# Patient Record
Sex: Female | Born: 1955
Health system: Southern US, Community
[De-identification: ages and names within clinical notes are randomized; demographics above are authoritative.]

## PROBLEM LIST (undated history)

## (undated) HISTORY — PX: BREAST CYST EXCISION: SHX579

## (undated) HISTORY — PX: APPENDECTOMY: SHX54

---

## 1998-11-04 ENCOUNTER — Observation Stay (HOSPITAL_COMMUNITY): Admission: AD | Admit: 1998-11-04 | Discharge: 1998-11-05 | Payer: Self-pay | Admitting: Obstetrics and Gynecology

## 1998-12-05 ENCOUNTER — Other Ambulatory Visit: Admission: RE | Admit: 1998-12-05 | Discharge: 1998-12-05 | Payer: Self-pay | Admitting: Obstetrics and Gynecology

## 2000-12-29 ENCOUNTER — Emergency Department (HOSPITAL_COMMUNITY): Admission: EM | Admit: 2000-12-29 | Discharge: 2000-12-29 | Payer: Self-pay | Admitting: Emergency Medicine

## 2002-02-09 ENCOUNTER — Emergency Department (HOSPITAL_COMMUNITY): Admission: EM | Admit: 2002-02-09 | Discharge: 2002-02-09 | Payer: Self-pay | Admitting: *Deleted

## 2004-12-17 ENCOUNTER — Emergency Department (HOSPITAL_COMMUNITY): Admission: EM | Admit: 2004-12-17 | Discharge: 2004-12-17 | Payer: Self-pay | Admitting: Emergency Medicine

## 2005-07-04 ENCOUNTER — Emergency Department (HOSPITAL_COMMUNITY): Admission: EM | Admit: 2005-07-04 | Discharge: 2005-07-04 | Payer: Self-pay | Admitting: *Deleted

## 2013-08-28 ENCOUNTER — Encounter (HOSPITAL_COMMUNITY): Payer: Self-pay | Admitting: Emergency Medicine

## 2013-08-28 ENCOUNTER — Emergency Department (HOSPITAL_COMMUNITY)
Admission: EM | Admit: 2013-08-28 | Discharge: 2013-08-28 | Disposition: A | Discharge status: Home / Self Care | Payer: Self-pay | Source: Home / Self Care | Attending: Emergency Medicine | Admitting: Emergency Medicine

## 2013-08-28 DIAGNOSIS — L02413 Cutaneous abscess of right upper limb: Secondary | ICD-10-CM

## 2013-08-28 DIAGNOSIS — IMO0002 Reserved for concepts with insufficient information to code with codable children: Secondary | ICD-10-CM

## 2013-08-28 MED ORDER — HYDROCODONE-ACETAMINOPHEN 5-325 MG PO TABS: ORAL_TABLET | ORAL | Status: DC

## 2013-08-28 MED ORDER — SULFAMETHOXAZOLE-TMP DS 800-160 MG PO TABS: 2.0000 | ORAL_TABLET | Freq: Two times a day (BID) | ORAL | Status: DC

## 2013-08-28 NOTE — ED Provider Notes (Signed)
Chief Complaint:   Chief Complaint  Patient presents with  . Recurrent Skin Infections    History of Present Illness:    Alexandra Robinson is a 57 year old female had a one half to two-week history of a painful, swollen area on her right forearm. This is felt hot but has not drained any blood or pus. She denies any fever. No prior history of skin infections, abscesses, or MRSA. She does not have diabetes.  Review of Systems:  Other than noted above, the patient denies any of the following symptoms: Systemic:  No fever, chills or sweats. Skin:  No rash or itching.  PMFSH:  Past medical history, family history, social history, meds, and allergies were reviewed.  No history of diabetes or prior history of abscesses or MRSA.  Physical Exam:   Vital signs:  BP 130/76  Pulse 70  Temp(Src) 98.4 F (36.9 C) (Oral)  Resp 16  SpO2 99% Skin:  There is a 6 cm raised, red, tender, indurated area on the right forearm with a small, central scab. There is no fluctuance.  Skin exam was otherwise normal.  No rash. Ext:  Distal pulses were full, patient has full ROM of all joints.    Procedure:  Verbal informed consent was obtained.  The patient was informed of the risks and benefits of the procedure and understands and accepts.  Identity of the patient was verified verbally and by wristband.   The abscess area described above was prepped with Betadine and alcohol and anesthetized with 3 mL of 2% Xylocaine with epinephrine.  Using a #11 scalpel blade, a singe straight incision was made into the area of fluctulence, yielding no prurulent drainage.  Routine cultures were obtained.  Blunt dissection was used to break up loculations and the resulting wound cavity was packed with 1/4 inch Iodoform gauze.  A sterile pressure dressing was applied.  Assessment:  The encounter diagnosis was Abscess of right forearm.  This may be more of a cellulitis and abscess. No pus was returned during the incision and  drainage.  Plan:   1.  Meds:  The following meds were prescribed:   Discharge Medication List as of 08/28/2013  5:36 PM    START taking these medications   Details  HYDROcodone-acetaminophen (NORCO/VICODIN) 5-325 MG per tablet 1 to 2 tabs every 4 to 6 hours as needed for pain., Print    sulfamethoxazole-trimethoprim (BACTRIM DS) 800-160 MG per tablet Take 2 tablets by mouth 2 (two) times daily., Starting 08/28/2013, Until Discontinued, Normal        2.  Patient Education/Counseling:  The patient was given appropriate handouts, self care instructions, and instructed in symptomatic relief.    3.  Follow up:  The patient was instructed to leave the dressing in place and remove the dressing and packing in 48 hours. Thereafter she may wash with soap and water, apply antibiotic ointment, and a Band-Aid. She was instructed to return if becoming worse in any way, and given some red flag symptoms such as fever which would prompt immediate return.  Follow up  Here as necessary.     Reuben Likes, MD 08/28/13 Rosamaria Lints

## 2013-08-28 NOTE — ED Notes (Signed)
Pt  Has  Red  Swollen   drianing    Area  On r  Arm       She  Reports  The  Symptoms  For  sev  Weeks  She  denys  Any  Known  Injury  She  Reports  As  Well  That  Other  Family  Members  Had  Similar  Symptoms  As  Well

## 2013-08-31 LAB — CULTURE, ROUTINE-ABSCESS: Special Requests: NORMAL

## 2013-09-02 ENCOUNTER — Telehealth (HOSPITAL_COMMUNITY): Payer: Self-pay | Admitting: *Deleted

## 2013-09-02 NOTE — ED Notes (Signed)
Abscess culture R forearm: Mod. MRSA.  Pt. adequately treated with Bactrim DS.  12/2 I called but line was busy.  12/4  I called. Pt. verified x 2 and given results.  Pt. told she was adequately treated and I reviewed the [facility] MRSA instructions with her. Pt.'s questions answered. Pt. voiced understanding. Vassie Moselle 09/02/2013

## 2016-01-22 ENCOUNTER — Encounter (HOSPITAL_COMMUNITY): Payer: Self-pay | Admitting: *Deleted

## 2016-01-22 ENCOUNTER — Ambulatory Visit (HOSPITAL_COMMUNITY)
Admission: EM | Admit: 2016-01-22 | Discharge: 2016-01-22 | Disposition: A | Discharge status: Home / Self Care | Payer: BLUE CROSS/BLUE SHIELD | Attending: Family Medicine | Admitting: Family Medicine

## 2016-01-22 DIAGNOSIS — J069 Acute upper respiratory infection, unspecified: Secondary | ICD-10-CM

## 2016-01-22 MED ORDER — AZITHROMYCIN 250 MG PO TABS: 250.0000 mg | ORAL_TABLET | Freq: Every day | ORAL | Status: DC

## 2016-01-22 MED ORDER — BENZONATATE 100 MG PO CAPS: 100.0000 mg | ORAL_CAPSULE | Freq: Three times a day (TID) | ORAL | Status: DC

## 2016-01-22 NOTE — ED Provider Notes (Signed)
CSN: [id number]     Arrival date & time 01/22/16  1515 History   First MD Initiated Contact with Patient 01/22/16 1628     Chief Complaint  Patient presents with  . URI  . Sore Throat  . Cough   (Consider location/radiation/quality/duration/timing/severity/associated sxs/prior Treatment) HPI Onset of chest cold 1 week ago. States she has been using OTC meds without relief of symptoms. 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 No associated pain, no PMH, non smoker.  History reviewed. No pertinent past medical history. Past Surgical History  Procedure Laterality Date  . Appendectomy     History reviewed. No pertinent family history. Social History  Substance Use Topics  . Smoking status: Never Smoker   . Smokeless tobacco: None  . Alcohol Use: No   OB History    No data available     Review of Systems ROS +'ve cough  Denies: HEADACHE, NAUSEA, ABDOMINAL PAIN, CHEST PAIN, CONGESTION, DYSURIA, SHORTNESS OF BREATH  Allergies  Review of patient's allergies indicates no known allergies.  Home Medications   Prior to Admission medications   Medication Sig Start Date End Date Taking? Authorizing Provider  azithromycin (ZITHROMAX) 250 MG tablet Take 1 tablet (250 mg total) by mouth daily. Take first 2 tablets together, then 1 every day until finished. 01/22/16   Konrad Felix, PA  benzonatate (TESSALON) 100 MG capsule Take 1 capsule (100 mg total) by mouth every 8 (eight) hours. 01/22/16   Konrad Felix, PA  HYDROcodone-acetaminophen (NORCO/VICODIN) 5-325 MG per tablet 1 to 2 tabs every 4 to 6 hours as needed for pain. 08/28/13   Harden Mo, MD  sulfamethoxazole-trimethoprim (BACTRIM DS) 800-160 MG per tablet Take 2 tablets by mouth 2 (two) times daily. 08/28/13   Harden Mo, MD   Meds Ordered and Administered this Visit  Medications - No data to display  BP 142/78 mmHg  Pulse 74  Temp(Src) 98.4 F (36.9 C) (Oral)  Resp 16  SpO2  99% No data found.   Physical Exam NURSES NOTES AND VITAL SIGNS REVIEWED. CONSTITUTIONAL: Well developed, well nourished, no acute distress HEENT: normocephalic, atraumatic, right and left TM's are normal EYES: Conjunctiva normal NECK:normal ROM, supple, no adenopathy PULMONARY:No respiratory distress, normal effort, Lungs: CTAb/l, no wheezes, or increased work of breathing CARDIOVASCULAR: RRR, no murmur ABDOMEN: soft, ND, NT, +'ve BS MUSCULOSKELETAL: Normal ROM of all extremities,  SKIN: warm and dry without rash PSYCHIATRIC: Mood and affect, behavior are normal  ED Course  Procedures (including critical care time)  Labs Review Labs Reviewed - No data to display  Imaging Review No results found.   Visual Acuity Review  Right Eye Distance:   Left Eye Distance:   Bilateral Distance:    Right Eye Near:   Left Eye Near:    Bilateral Near:      RX zpak, tessalon   MDM   1. Acute URI     Patient is reassured that there are no issues that require transfer to higher level of care at this time or additional tests. Patient is advised to continue home symptomatic treatment. Patient is advised that if there are new or worsening symptoms to attend the emergency department, contact primary care provider, or return to UC. Instructions of care provided discharged home in stable condition.    THIS NOTE WAS GENERATED USING A VOICE RECOGNITION SOFTWARE PROGRAM. ALL REASONABLE EFFORTS  WERE MADE TO PROOFREAD THIS DOCUMENT FOR ACCURACY.  I have verbally  reviewed the discharge instructions with the patient. A printed AVS was given to the patient.  All questions were answered prior to discharge.      Konrad Felix, PA 01/22/16 1737

## 2016-01-22 NOTE — Discharge Instructions (Signed)
Upper Respiratory Infection, Adult Most upper respiratory infections (URIs) are a viral infection of the air passages leading to the lungs. A URI affects the nose, throat, and upper air passages. The most common type of URI is nasopharyngitis and is typically referred to as "the common cold." URIs run their course and usually go away on their own. Most of the time, a URI does not require medical attention, but sometimes a bacterial infection in the upper airways can follow a viral infection. This is called a secondary infection. Sinus and middle ear infections are common types of secondary upper respiratory infections. Bacterial pneumonia can also complicate a URI. A URI can worsen asthma and chronic obstructive pulmonary disease (COPD). Sometimes, these complications can require emergency medical care and may be life threatening.  CAUSES Almost all URIs are caused by viruses. A virus is a type of germ and can spread from one person to another.  RISKS FACTORS You may be at risk for a URI if:   You smoke.   You have chronic heart or lung disease.  You have a weakened defense (immune) system.   You are very young or very old.   You have nasal allergies or asthma.  You work in crowded or poorly ventilated areas.  You work in health care facilities or schools. SIGNS AND SYMPTOMS  Symptoms typically develop 2-3 days after you come in contact with a cold virus. Most viral URIs last 7-10 days. However, viral URIs from the influenza virus (flu virus) can last 14-18 days and are typically more severe. Symptoms may include:   Runny or stuffy (congested) nose.   Sneezing.   Cough.   Sore throat.   Headache.   Fatigue.   Fever.   Loss of appetite.   Pain in your forehead, behind your eyes, and over your cheekbones (sinus pain).  Muscle aches.  DIAGNOSIS  Your health care provider may diagnose a URI by:  Physical exam.  Tests to check that your symptoms are not due to  another condition such as:  Strep throat.  Sinusitis.  Pneumonia.  Asthma. TREATMENT  A URI goes away on its own with time. It cannot be cured with medicines, but medicines may be prescribed or recommended to relieve symptoms. Medicines may help:  Reduce your fever.  Reduce your cough.  Relieve nasal congestion. HOME CARE INSTRUCTIONS   Take medicines only as directed by your health care provider.   Gargle warm saltwater or take cough drops to comfort your throat as directed by your health care provider.  Use a warm mist humidifier or inhale steam from a shower to increase air moisture. This may make it easier to breathe.  Drink enough fluid to keep your urine clear or pale yellow.   Eat soups and other clear broths and maintain good nutrition.   Rest as needed.   Return to work when your temperature has returned to normal or as your health care provider advises. You may need to stay home longer to avoid infecting others. You can also use a face mask and careful hand washing to prevent spread of the virus.  Increase the usage of your inhaler if you have asthma.   Do not use any tobacco products, including cigarettes, chewing tobacco, or electronic cigarettes. If you need help quitting, ask your health care provider. PREVENTION  The best way to protect yourself from getting a cold is to practice good hygiene.   Avoid oral or hand contact with people with cold  symptoms.   Wash your hands often if contact occurs.  There is no clear evidence that vitamin C, vitamin E, echinacea, or exercise reduces the chance of developing a cold. However, it is always recommended to get plenty of rest, exercise, and practice good nutrition.  SEEK MEDICAL CARE IF:   You are getting worse rather than better.   Your symptoms are not controlled by medicine.   You have chills.  You have worsening shortness of breath.  You have brown or red mucus.  You have yellow or brown nasal  discharge.  You have pain in your face, especially when you bend forward.  You have a fever.  You have swollen neck glands.  You have pain while swallowing.  You have white areas in the back of your throat. SEEK IMMEDIATE MEDICAL CARE IF:   You have severe or persistent:  Headache.  Ear pain.  Sinus pain.  Chest pain.  You have chronic lung disease and any of the following:  Wheezing.  Prolonged cough.  Coughing up blood.  A change in your usual mucus.  You have a stiff neck.  You have changes in your:  Vision.  Hearing.  Thinking.  Mood. MAKE SURE YOU:   Understand these instructions.  Will watch your condition.  Will get help right away if you are not doing well or get worse.   This information is not intended to replace advice given to you by your health care provider. Make sure you discuss any questions you have with your health care provider.   Document Released: 03/12/2001 Document Revised: 01/31/2015 Document Reviewed: 12/22/2013 Elsevier Interactive Patient Education 2016 [employer]  Cough, Adult A cough helps to clear your throat and lungs. A cough may last only 2-3 weeks (acute), or it may last longer than 8 weeks (chronic). Many different things can cause a cough. A cough may be a sign of an illness or another medical condition. HOME CARE  Pay attention to any changes in your cough.  Take medicines only as told by your doctor.  If you were prescribed an antibiotic medicine, take it as told by your doctor. Do not stop taking it even if you start to feel better.  Talk with your doctor before you try using a cough medicine.  Drink enough fluid to keep your pee (urine) clear or pale yellow.  If the air is dry, use a cold steam vaporizer or humidifier in your home.  Stay away from things that make you cough at work or at home.  If your cough is worse at night, try using extra pillows to raise your head up higher while you  sleep.  Do not smoke, and try not to be around smoke. If you need help quitting, ask your doctor.  Do not have caffeine.  Do not drink alcohol.  Rest as needed. GET HELP IF:  You have new problems (symptoms).  You cough up yellow fluid (pus).  Your cough does not get better after 2-3 weeks, or your cough gets worse.  Medicine does not help your cough and you are not sleeping well.  You have pain that gets worse or pain that is not helped with medicine.  You have a fever.  You are losing weight and you do not know why.  You have night sweats. GET HELP RIGHT AWAY IF:  You cough up blood.  You have trouble breathing.  Your heartbeat is very fast.   This information is not intended to replace  advice given to you by your health care provider. Make sure you discuss any questions you have with your health care provider. °  °Document Released: 05/30/2011 Document Revised: 06/07/2015 Document Reviewed: 11/23/2014 °Elsevier Interactive Patient Education ©2016 [employer] ° °

## 2016-01-22 NOTE — ED Notes (Addendum)
Patient reports productive cough x 1 week, reports had fever and chills throughout the last week, with sore throat. Reports no relief with home medications. Patient reports at night she feels as if she is wheezing, reports phlegm very productive at night.

## 2016-02-12 ENCOUNTER — Encounter (HOSPITAL_COMMUNITY): Payer: Self-pay | Admitting: Emergency Medicine

## 2016-02-12 ENCOUNTER — Ambulatory Visit (INDEPENDENT_AMBULATORY_CARE_PROVIDER_SITE_OTHER): Payer: BLUE CROSS/BLUE SHIELD

## 2016-02-12 ENCOUNTER — Ambulatory Visit (HOSPITAL_COMMUNITY)
Admission: EM | Admit: 2016-02-12 | Discharge: 2016-02-12 | Disposition: A | Discharge status: Home / Self Care | Payer: BLUE CROSS/BLUE SHIELD | Attending: Family Medicine | Admitting: Family Medicine

## 2016-02-12 DIAGNOSIS — M79605 Pain in left leg: Secondary | ICD-10-CM | POA: Diagnosis not present

## 2016-02-12 DIAGNOSIS — J40 Bronchitis, not specified as acute or chronic: Secondary | ICD-10-CM | POA: Diagnosis not present

## 2016-02-12 MED ORDER — ALBUTEROL SULFATE HFA 108 (90 BASE) MCG/ACT IN AERS: 2.0000 | INHALATION_SPRAY | RESPIRATORY_TRACT | Status: DC | PRN

## 2016-02-12 MED ORDER — PREDNISONE 10 MG PO TABS: 20.0000 mg | ORAL_TABLET | Freq: Every day | ORAL | Status: DC

## 2016-02-12 NOTE — ED Notes (Signed)
Patient seen in April/2017 for uri.  Patient reports she continues to have a cough,  Patient is requesting a chest xray.   Patient reports left left leg pain pain for 2 years.  Patient reports pain starts in left foot, sometimes top of foot, sometimes under foot.  Reports pain goes up leg to her back.  Patient has had worsening symptoms over the last 2 weeks.  Particularly had pain when lifted lower leg, bending knee.

## 2016-02-12 NOTE — Discharge Instructions (Signed)
How to Use an Inhaler Using your inhaler correctly is very important. Good technique will make sure that the medicine reaches your lungs.  HOW TO USE AN INHALER:  Take the cap off the inhaler.  If this is the first time using your inhaler, you need to prime it. Shake the inhaler for 5 seconds. Release four puffs into the air, away from your face. Ask your doctor for help if you have questions.  Shake the inhaler for 5 seconds.  Turn the inhaler so the bottle is above the mouthpiece.  Put your pointer finger on top of the bottle. Your thumb holds the bottom of the inhaler.  Open your mouth.  Either hold the inhaler away from your mouth (the width of 2 fingers) or place your lips tightly around the mouthpiece. Ask your doctor which way to use your inhaler.  Breathe out as much air as possible.  Breathe in and push down on the bottle 1 time to release the medicine. You will feel the medicine go in your mouth and throat.  Continue to take a deep breath in very slowly. Try to fill your lungs.  After you have breathed in completely, hold your breath for 10 seconds. This will help the medicine to settle in your lungs. If you cannot hold your breath for 10 seconds, hold it for as long as you can before you breathe out.  Breathe out slowly, through pursed lips. Whistling is an example of pursed lips.  If your doctor has told you to take more than 1 puff, wait at least 15-30 seconds between puffs. This will help you get the best results from your medicine. Do not use the inhaler more than your doctor tells you to.  Put the cap back on the inhaler.  Follow the directions from your doctor or from the inhaler package about cleaning the inhaler. If you use more than one inhaler, ask your doctor which inhalers to use and what order to use them in. Ask your doctor to help you figure out when you will need to refill your inhaler.  If you use a steroid inhaler, always rinse your mouth with water  after your last puff, gargle and spit out the water. Do not swallow the water. GET HELP IF:  The inhaler medicine only partially helps to stop wheezing or shortness of breath.  You are having trouble using your inhaler.  You have some increase in thick spit (phlegm). GET HELP RIGHT AWAY IF:  The inhaler medicine does not help your wheezing or shortness of breath or you have tightness in your chest.  You have dizziness, headaches, or fast heart rate.  You have chills, fever, or night sweats.  You have a large increase of thick spit, or your thick spit is bloody. MAKE SURE YOU:   Understand these instructions.  Will watch your condition.  Will get help right away if you are not doing well or get worse.   This information is not intended to replace advice given to you by your health care provider. Make sure you discuss any questions you have with your health care provider.   Document Released: 06/25/2008 Document Revised: 07/07/2013 Document Reviewed: 04/15/2013 Elsevier Interactive Patient Education 2016 [place] therapy can help ease sore, stiff, injured, and tight muscles and joints. Heat relaxes your muscles, which may help ease your pain. Heat therapy should only be used on old, pre-existing, or long-lasting (chronic) injuries. Do not use heat therapy unless  told by your doctor. HOW TO USE HEAT THERAPY There are several different kinds of heat therapy, including:  Moist heat pack.  Warm water bath.  Hot water bottle.  Electric heating pad.  Heated gel pack.  Heated wrap.  Electric heating pad. GENERAL HEAT THERAPY RECOMMENDATIONS   Do not sleep while using heat therapy. Only use heat therapy while you are awake.  Your skin may turn pink while using heat therapy. Do not use heat therapy if your skin turns red.  Do not use heat therapy if you have new pain.  High heat or long exposure to heat can cause burns. Be careful when using heat  therapy to avoid burning your skin.  Do not use heat therapy on areas of your skin that are already irritated, such as with a rash or sunburn. GET HELP IF:   You have blisters, redness, swelling (puffiness), or numbness.  You have new pain.  Your pain is worse. MAKE SURE YOU:  Understand these instructions.  Will watch your condition.  Will get help right away if you are not doing well or get worse.   This information is not intended to replace advice given to you by your health care provider. Make sure you discuss any questions you have with your health care provider.   Document Released: 12/09/2011 Document Revised: 10/07/2014 Document Reviewed: 11/09/2013 Elsevier Interactive Patient Education 2016 [employer] Acute Bronchitis Bronchitis is inflammation of the airways that extend from the windpipe into the lungs (bronchi). The inflammation often causes mucus to develop. This leads to a cough, which is the most common symptom of bronchitis.  In acute bronchitis, the condition usually develops suddenly and goes away over time, usually in a couple weeks. Smoking, allergies, and asthma can make bronchitis worse. Repeated episodes of bronchitis may cause further lung problems.  CAUSES Acute bronchitis is most often caused by the same virus that causes a cold. The virus can spread from person to person (contagious) through coughing, sneezing, and touching contaminated objects. SIGNS AND SYMPTOMS   Cough.   Fever.   Coughing up mucus.   Body aches.   Chest congestion.   Chills.   Shortness of breath.   Sore throat.  DIAGNOSIS  Acute bronchitis is usually diagnosed through a physical exam. Your health care provider will also ask you questions about your medical history. Tests, such as chest X-rays, are sometimes done to rule out other conditions.  TREATMENT  Acute bronchitis usually goes away in a couple weeks. Oftentimes, no medical treatment is necessary.  Medicines are sometimes given for relief of fever or cough. Antibiotic medicines are usually not needed but may be prescribed in certain situations. In some cases, an inhaler may be recommended to help reduce shortness of breath and control the cough. A cool mist vaporizer may also be used to help thin bronchial secretions and make it easier to clear the chest.  HOME CARE INSTRUCTIONS  Get plenty of rest.   Drink enough fluids to keep your urine clear or pale yellow (unless you have a medical condition that requires fluid restriction). Increasing fluids may help thin your respiratory secretions (sputum) and reduce chest congestion, and it will prevent dehydration.   Take medicines only as directed by your health care provider.  If you were prescribed an antibiotic medicine, finish it all even if you start to feel better.  Avoid smoking and secondhand smoke. Exposure to cigarette smoke or irritating chemicals will make bronchitis worse. If you are a  smoker, consider using nicotine gum or skin patches to help control withdrawal symptoms. Quitting smoking will help your lungs heal faster.   Reduce the chances of another bout of acute bronchitis by washing your hands frequently, avoiding people with cold symptoms, and trying not to touch your hands to your mouth, nose, or eyes.   Keep all follow-up visits as directed by your health care provider.  SEEK MEDICAL CARE IF: Your symptoms do not improve after 1 week of treatment.  SEEK IMMEDIATE MEDICAL CARE IF:  You develop an increased fever or chills.   You have chest pain.   You have severe shortness of breath.  You have bloody sputum.   You develop dehydration.  You faint or repeatedly feel like you are going to pass out.  You develop repeated vomiting.  You develop a severe headache. MAKE SURE YOU:   Understand these instructions.  Will watch your condition.  Will get help right away if you are not doing well or get  worse.   This information is not intended to replace advice given to you by your health care provider. Make sure you discuss any questions you have with your health care provider.   Document Released: 10/24/2004 Document Revised: 10/07/2014 Document Reviewed: 03/09/2013 Elsevier Interactive Patient Education 2016 [place] Cyst A Baker cyst is a sac-like structure that forms in the back of the knee. It is filled with the same fluid that is located in your knee. This fluid lubricates the bones and cartilage of the knee and allows them to move over each other more easily. CAUSES  When the knee becomes injured or inflamed, increased fluid forms in the knee. When this happens, the joint lining is pushed out behind the knee and forms the Baker cyst. This cyst may also be caused by inflammation from arthritic conditions and infections. SIGNS AND SYMPTOMS  A Baker cyst usually has no symptoms. When the cyst is substantially enlarged:  You may feel pressure behind the knee, stiffness in the knee, or a mass in the area behind the knee.  You may develop pain, redness, and swelling in the calf. This can suggest a blood clot and requires evaluation by your health care provider. DIAGNOSIS  A Baker cyst is most often found during an ultrasound exam. This exam may have been performed for other reasons, and the cyst was found incidentally. Sometimes an MRI is used. This picks up other problems within a joint that an ultrasound exam may not. If the Baker cyst developed immediately after an injury, X-ray exams may be used to diagnose the cyst. TREATMENT  The treatment depends on the cause of the cyst. Anti-inflammatory medicines and rest often will be prescribed. If the cyst is caused by a bacterial infection, antibiotic medicines may be prescribed.  HOME CARE INSTRUCTIONS   If the cyst was caused by an injury, for the first 24 hours, keep the injured leg elevated on 2 pillows while lying  down.  For the first 24 hours while you are awake, apply ice to the injured area:  Put ice in a plastic bag.  Place a towel between your skin and the bag.  Leave the ice on for 20 minutes, 2-3 times a day.  Only take over-the-counter or prescription medicines for pain, discomfort, or fever as directed by your health care provider.  Only take antibiotic medicine as directed. Make sure to finish it even if you start to feel better. MAKE SURE YOU:   Understand these  instructions.  Will watch your condition.  Will get help right away if you are not doing well or get worse.   This information is not intended to replace advice given to you by your health care provider. Make sure you discuss any questions you have with your health care provider.   Document Released: 09/16/2005 Document Revised: 07/07/2013 Document Reviewed: 04/28/2013 Elsevier Interactive Patient Education [employer].

## 2016-02-12 NOTE — ED Provider Notes (Signed)
CSN: [id number]     Arrival date & time 02/12/16  1358 History   First MD Initiated Contact with Patient 02/12/16 1501     Chief Complaint  Patient presents with  . URI  . Leg Pain   (Consider location/radiation/quality/duration/timing/severity/associated sxs/prior Treatment) HPI History obtained from patient:  Pt presents with the cc of: Cough and left leg pain Duration of symptoms: Cough has been several weeks left leg pain over the last week Treatment prior to arrival: Treatment of cough was with albuterol and a visit to urgent care no treatment for left leg pain Context: Patient states that she has had persistent cough since being treated in the urgent care for reactive airway. She describes the cough is dry and nonproductive. She has had left leg pain for several months by her report but getting worse over the last week starts in her foot and works its way up to mid thigh. She states that it does not bother her buttock or her back. She does work as a Regulatory affairs officer. Other symptoms include: Dry non-productive cough Pain score: 4-5 at its worse FAMILY HISTORY: SOCIAL HISTORY: Nonsmoker  History reviewed. No pertinent past medical history. Past Surgical History  Procedure Laterality Date  . Appendectomy     No family history on file. Social History  Substance Use Topics  . Smoking status: Never Smoker   . Smokeless tobacco: None  . Alcohol Use: No   OB History    No data available     Review of Systems ROS +'ve left leg pain, cough  Denies: HEADACHE, NAUSEA, ABDOMINAL PAIN, CHEST PAIN, CONGESTION, DYSURIA, SHORTNESS OF BREATH  Allergies  Review of patient's allergies indicates no known allergies.  Home Medications   Prior to Admission medications   Medication Sig Start Date End Date Taking? Authorizing Provider  azithromycin (ZITHROMAX) 250 MG tablet Take 1 tablet (250 mg total) by mouth daily. Take first 2 tablets together, then 1 every day until finished. Patient not  taking: Reported on 02/12/2016 01/22/16   Konrad Felix, PA  benzonatate (TESSALON) 100 MG capsule Take 1 capsule (100 mg total) by mouth every 8 (eight) hours. 01/22/16   Konrad Felix, PA  HYDROcodone-acetaminophen (NORCO/VICODIN) 5-325 MG per tablet 1 to 2 tabs every 4 to 6 hours as needed for pain. 08/28/13   Harden Mo, MD  sulfamethoxazole-trimethoprim (BACTRIM DS) 800-160 MG per tablet Take 2 tablets by mouth 2 (two) times daily. Patient not taking: Reported on 02/12/2016 08/28/13   Harden Mo, MD   Meds Ordered and Administered this Visit  Medications - No data to display  BP 126/62 mmHg  Pulse 74  Temp(Src) 99.3 F (37.4 C) (Oral)  Resp 12  SpO2 98% No data found.   Physical Exam NURSES NOTES AND VITAL SIGNS REVIEWED. CONSTITUTIONAL: Well developed, well nourished, no acute distress HEENT: normocephalic, atraumatic EYES: Conjunctiva normal NECK:normal ROM, supple, no adenopathy PULMONARY:No respiratory distress, normal effort ABDOMINAL: Soft, ND, NT BS+, No CVAT MUSCULOSKELETAL: Normal ROM of all extremities, some tenderness, popliteal region of the left leg. No calf tenderness. No hip tenderness. SKIN: warm and dry without rash PSYCHIATRIC: Mood and affect, behavior are normal  ED Course  Procedures (including critical care time)  Labs Review Labs Reviewed - No data to display  Imaging Review Dg Chest 2 View  02/12/2016  CLINICAL DATA:  Cough for 2-3 weeks. EXAM: CHEST  2 VIEW COMPARISON:  None. FINDINGS: The cardiac silhouette is within normal limits. The thoracic aorta  is mildly tortuous. There is mild central airway thickening and slight diffuse interstitial prominence. No lobar consolidation, overt pulmonary edema, pleural effusion, or pneumothorax is identified. Thoracic spondylosis is noted. IMPRESSION: Airway thickening which may reflect bronchitis. Electronically Signed   By: Logan Bores M.D.   On: 02/12/2016 16:22   I HAVE PERSONALLY  REVIEWED AND  DISCUSSED RESULTS OF  X-RAYS WITH PATIENT PRIOR TO DISCHARGE.     Visual Acuity Review  Right Eye Distance:   Left Eye Distance:   Bilateral Distance:    Right Eye Near:   Left Eye Near:    Bilateral Near:       Prescriptions for albuterol and prednisone  MDM   1. Bronchitis   2. Pain In Left Leg     Cough previous Leg new  Patient is reassured that there are no issues that require transfer to higher level of care at this time or additional tests. Patient is advised to continue home symptomatic treatment. Patient is advised that if there are new or worsening symptoms to attend the emergency department, contact primary care provider, or return to UC. Instructions of care provided discharged home in stable condition.    THIS NOTE WAS GENERATED USING A VOICE RECOGNITION SOFTWARE PROGRAM. ALL REASONABLE EFFORTS  WERE MADE TO PROOFREAD THIS DOCUMENT FOR ACCURACY.  I have verbally reviewed the discharge instructions with the patient. A printed AVS was given to the patient.  All questions were answered prior to discharge.      Konrad Felix, [place] 02/12/16 [phone]

## 2016-02-20 ENCOUNTER — Inpatient Hospital Stay: Payer: BLUE CROSS/BLUE SHIELD | Admitting: Internal Medicine

## 2017-09-26 ENCOUNTER — Ambulatory Visit (HOSPITAL_COMMUNITY)
Admission: EM | Admit: 2017-09-26 | Discharge: 2017-09-26 | Disposition: A | Discharge status: Home / Self Care | Payer: BLUE CROSS/BLUE SHIELD | Attending: Internal Medicine | Admitting: Internal Medicine

## 2017-09-26 ENCOUNTER — Ambulatory Visit (INDEPENDENT_AMBULATORY_CARE_PROVIDER_SITE_OTHER): Payer: BLUE CROSS/BLUE SHIELD

## 2017-09-26 ENCOUNTER — Encounter (HOSPITAL_COMMUNITY): Payer: Self-pay | Admitting: Family Medicine

## 2017-09-26 DIAGNOSIS — S161XXA Strain of muscle, fascia and tendon at neck level, initial encounter: Secondary | ICD-10-CM

## 2017-09-26 DIAGNOSIS — M542 Cervicalgia: Secondary | ICD-10-CM

## 2017-09-26 DIAGNOSIS — M62838 Other muscle spasm: Secondary | ICD-10-CM

## 2017-09-26 MED ORDER — KETOROLAC TROMETHAMINE 60 MG/2ML IM SOLN
15.0000 mg | Freq: Once | INTRAMUSCULAR | Status: AC
  Administered 2017-09-26: 15 mg via INTRAMUSCULAR

## 2017-09-26 MED ORDER — METHOCARBAMOL 500 MG PO TABS: 500.0000 mg | ORAL_TABLET | Freq: Four times a day (QID) | ORAL | 0 refills | Status: DC | PRN

## 2017-09-26 MED ORDER — KETOROLAC TROMETHAMINE 30 MG/ML IJ SOLN
INTRAMUSCULAR | Status: AC
  Filled 2017-09-26: qty 1

## 2017-09-26 MED ORDER — NAPROXEN 500 MG PO TABS: 500.0000 mg | ORAL_TABLET | Freq: Two times a day (BID) | ORAL | 0 refills | Status: DC

## 2017-09-26 NOTE — ED Provider Notes (Signed)
[name]    CSN: [id number] Arrival date & time: 09/26/17  1132     History   Chief Complaint Chief Complaint  Patient presents with  . Neck Pain    HPI Alexandra Robinson is a 61 y.o. female.   61 year old female, presenting today complaining of left-sided neck pain.  States that approximately 3 days ago, she had a some tightness in the left side of her neck.  Since that time, she is noticed some intermittent pain that she believes is in spasm.  States that the pain radiates into her left arm and upper back.  She denies any known injury.  She has not tried anything at home for her symptoms.   The history is provided by the patient.  Back Pain  Pain location: neck. Quality:  Aching Radiates to: left arm  Pain severity:  Moderate Pain is:  Same all the time Onset quality:  Gradual Duration:  3 days Timing:  Intermittent Progression:  Unchanged Chronicity:  New Context: not emotional stress, not falling, not jumping from heights and not lifting heavy objects   Relieved by:  Nothing Worsened by:  Nothing Ineffective treatments:  Being still Associated symptoms: no abdominal pain, no abdominal swelling, no bladder incontinence, no bowel incontinence, no chest pain, no dysuria, no fever, no headaches, no leg pain, no numbness, no paresthesias, no pelvic pain, no perianal numbness, no tingling, no weakness and no weight loss   Risk factors: obesity   Risk factors: no hx of cancer, no hx of osteoporosis, no lack of exercise, no menopause and not pregnant     History reviewed. No pertinent past medical history.  There are no active problems to display for this patient.   Past Surgical History:  Procedure Laterality Date  . APPENDECTOMY      OB History    No data available       Home Medications    Prior to Admission medications   Medication Sig Start Date End Date Taking? Authorizing Provider  albuterol (PROVENTIL HFA;VENTOLIN HFA) 108 (90 Base)  MCG/ACT inhaler Inhale 2 puffs into the lungs every 4 (four) hours as needed for wheezing or shortness of breath. 02/12/16   Konrad Felix, PA  methocarbamol (ROBAXIN) 500 MG tablet Take 1 tablet (500 mg total) by mouth every 6 (six) hours as needed for muscle spasms. 09/26/17   Bereket Gernert C, PA-C  naproxen (NAPROSYN) 500 MG tablet Take 1 tablet (500 mg total) by mouth 2 (two) times daily. 09/26/17   Phebe Colla, PA-C    Family History History reviewed. No pertinent family history.  Social History Social History   Tobacco Use  . Smoking status: Never Smoker  Substance Use Topics  . Alcohol use: No  . Drug use: Not on file     Allergies   Patient has no known allergies.   Review of Systems Review of Systems  Constitutional: Negative for chills, fever and weight loss.  HENT: Negative for ear pain and sore throat.   Eyes: Negative for pain and visual disturbance.  Respiratory: Negative for cough and shortness of breath.   Cardiovascular: Negative for chest pain and palpitations.  Gastrointestinal: Negative for abdominal pain, bowel incontinence and vomiting.  Genitourinary: Negative for bladder incontinence, dysuria, hematuria and pelvic pain.  Musculoskeletal: Positive for neck pain. Negative for arthralgias and back pain.  Skin: Negative for color change and rash.  Neurological: Negative for tingling, seizures, syncope, weakness, numbness, headaches and paresthesias.  All  other systems reviewed and are negative.    Physical Exam Triage Vital Signs ED Triage Vitals  Enc Vitals Group     BP 09/26/17 1212 129/75     Pulse Rate 09/26/17 1212 76     Resp 09/26/17 1212 18     Temp 09/26/17 1212 98.3 F (36.8 C)     Temp src --      SpO2 09/26/17 1212 100 %     Weight --      Height --      Head Circumference --      Peak Flow --      Pain Score 09/26/17 1210 10     Pain Loc --      Pain Edu? --      Excl. in [place]? --    No data found.  Updated Vital  Signs BP 129/75 (BP Location: Left Arm)   Pulse 76   Temp 98.3 F (36.8 C)   Resp 18   SpO2 100%   Visual Acuity Right Eye Distance:   Left Eye Distance:   Bilateral Distance:    Right Eye Near:   Left Eye Near:    Bilateral Near:     Physical Exam  Constitutional: She is oriented to person, place, and time. She appears well-developed and well-nourished. No distress.  HENT:  Head: Normocephalic.  Eyes: EOM are normal. Pupils are equal, round, and reactive to light.  Neck: Normal range of motion.  Cardiovascular: Normal rate.  Pulmonary/Chest: Effort normal and breath sounds normal.  Abdominal: Soft.  Musculoskeletal: Normal range of motion.       Cervical back: She exhibits tenderness.  Tenderness palpation of the left-sided paraspinal musculature of the cervical region.  There is mild midline tenderness to palpation.  Normal strength and sensation of the upper extremities  Neurological: She is alert and oriented to person, place, and time. She has normal strength and normal reflexes. No cranial nerve deficit. GCS eye subscore is 4. GCS verbal subscore is 5. GCS motor subscore is 6.  Skin: Skin is warm. She is not diaphoretic.  Psychiatric: She has a normal mood and affect.  Nursing note and vitals reviewed.    UC Treatments / Results  Labs (all labs ordered are listed, but only abnormal results are displayed) Labs Reviewed - No data to display  EKG  EKG Interpretation None       Radiology Dg Cervical Spine Complete  Result Date: 09/26/2017 CLINICAL DATA:  Left-sided neck pain. EXAM: CERVICAL SPINE - COMPLETE 4+ VIEW COMPARISON:  None FINDINGS: There is no evidence of cervical spine fracture or prevertebral soft tissue swelling. Alignment is normal. No significant disc space narrowing. Small anterior osteophytes at multiple levels. IMPRESSION: No acute abnormality of the cervical spine. Minimal degenerative changes. Electronically Signed   By: Lorriane Shire M.D.    On: 09/26/2017 13:01    Procedures Procedures (including critical care time)  Medications Ordered in UC Medications  ketorolac (TORADOL) injection 15 mg (15 mg Intramuscular Given 09/26/17 1304)     Initial Impression / Assessment and Plan / UC Course  I have reviewed the triage vital signs and the nursing notes.  Pertinent labs & imaging results that were available during my care of the patient were reviewed by me and considered in my medical decision making (see chart for details).     No acute findings on x-ray of the cervical spine.  Patient likely has musculoskeletal strain with muscle spasm.  Will treat  with muscle relaxant and NSAIDs.  Recommended ice/heat for comfort.  Final Clinical Impressions(s) / UC Diagnoses   Final diagnoses:  Muscle spasms of neck  Acute strain of neck muscle, initial encounter    ED Discharge Orders        Ordered    methocarbamol (ROBAXIN) 500 MG tablet  Every 6 hours PRN     09/26/17 1306    naproxen (NAPROSYN) 500 MG tablet  2 times daily     09/26/17 1306       Controlled Substance Prescriptions [place] Controlled Substance Registry consulted? Not Applicable   Phebe Colla, [place] 09/26/17 1428

## 2017-09-26 NOTE — ED Triage Notes (Signed)
Pt here for spasms in left lateral neck and radiation into back and hand. Denies fall or injury.

## 2017-11-21 ENCOUNTER — Other Ambulatory Visit: Payer: Self-pay | Admitting: Obstetrics and Gynecology

## 2017-11-21 DIAGNOSIS — R928 Other abnormal and inconclusive findings on diagnostic imaging of breast: Secondary | ICD-10-CM

## 2017-11-26 ENCOUNTER — Other Ambulatory Visit: Payer: BLUE CROSS/BLUE SHIELD

## 2017-12-01 ENCOUNTER — Other Ambulatory Visit: Payer: Self-pay | Admitting: Obstetrics and Gynecology

## 2017-12-01 ENCOUNTER — Ambulatory Visit
Admission: RE | Admit: 2017-12-01 | Discharge: 2017-12-01 | Disposition: A | Discharge status: Home / Self Care | Payer: BLUE CROSS/BLUE SHIELD | Source: Ambulatory Visit | Attending: Obstetrics and Gynecology | Admitting: Obstetrics and Gynecology

## 2017-12-01 DIAGNOSIS — R928 Other abnormal and inconclusive findings on diagnostic imaging of breast: Secondary | ICD-10-CM

## 2017-12-01 DIAGNOSIS — N6320 Unspecified lump in the left breast, unspecified quadrant: Secondary | ICD-10-CM

## 2017-12-01 DIAGNOSIS — R599 Enlarged lymph nodes, unspecified: Secondary | ICD-10-CM

## 2017-12-01 DIAGNOSIS — R921 Mammographic calcification found on diagnostic imaging of breast: Secondary | ICD-10-CM

## 2017-12-08 ENCOUNTER — Other Ambulatory Visit: Payer: BLUE CROSS/BLUE SHIELD

## 2018-01-27 ENCOUNTER — Other Ambulatory Visit: Payer: Self-pay | Admitting: Obstetrics and Gynecology

## 2018-01-27 DIAGNOSIS — N6320 Unspecified lump in the left breast, unspecified quadrant: Secondary | ICD-10-CM

## 2018-01-30 ENCOUNTER — Other Ambulatory Visit: Payer: BLUE CROSS/BLUE SHIELD

## 2018-01-30 ENCOUNTER — Inpatient Hospital Stay: Admission: RE | Admit: 2018-01-30 | Payer: BLUE CROSS/BLUE SHIELD | Source: Ambulatory Visit

## 2018-02-04 ENCOUNTER — Ambulatory Visit
Admission: RE | Admit: 2018-02-04 | Discharge: 2018-02-04 | Disposition: A | Discharge status: Home / Self Care | Payer: BLUE CROSS/BLUE SHIELD | Source: Ambulatory Visit | Attending: Obstetrics and Gynecology | Admitting: Obstetrics and Gynecology

## 2018-02-04 DIAGNOSIS — N6320 Unspecified lump in the left breast, unspecified quadrant: Secondary | ICD-10-CM

## 2018-02-04 DIAGNOSIS — R599 Enlarged lymph nodes, unspecified: Secondary | ICD-10-CM

## 2018-02-04 DIAGNOSIS — R921 Mammographic calcification found on diagnostic imaging of breast: Secondary | ICD-10-CM

## 2018-02-13 ENCOUNTER — Other Ambulatory Visit: Payer: Self-pay | Admitting: Surgery

## 2018-02-13 DIAGNOSIS — D0512 Intraductal carcinoma in situ of left breast: Secondary | ICD-10-CM

## 2018-02-25 ENCOUNTER — Ambulatory Visit
Admission: RE | Admit: 2018-02-25 | Discharge: 2018-02-25 | Disposition: A | Discharge status: Home / Self Care | Payer: BLUE CROSS/BLUE SHIELD | Source: Ambulatory Visit | Attending: Surgery | Admitting: Surgery

## 2018-02-25 DIAGNOSIS — D0512 Intraductal carcinoma in situ of left breast: Secondary | ICD-10-CM

## 2018-02-25 MED ORDER — GADOBENATE DIMEGLUMINE 529 MG/ML IV SOLN
20.0000 mL | Freq: Once | INTRAVENOUS | Status: AC | PRN
  Administered 2018-02-25: 20 mL via INTRAVENOUS

## 2018-02-27 ENCOUNTER — Telehealth: Payer: Self-pay | Admitting: Diagnostic Radiology

## 2018-02-27 NOTE — Progress Notes (Signed)
Returning patient's call. Left message.

## 2018-03-03 ENCOUNTER — Telehealth: Payer: Self-pay | Admitting: Diagnostic Radiology

## 2018-03-03 NOTE — Progress Notes (Signed)
I returned the patient's page and talked with her for a long time today about her recent diagnosis of breast cancer.  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 The area of calcifications is large, and she has been told by Dr.Tsuei that she needs a mastectomy.   I encouraged her to pursue treatment.  We discussed the natural progression of DCIS.  I asked her to return in 6 months for mammogram if she does not have treatment at this time.

## 2018-06-17 ENCOUNTER — Telehealth: Payer: Self-pay | Admitting: Hematology and Oncology

## 2018-06-17 ENCOUNTER — Encounter: Payer: Self-pay | Admitting: Hematology and Oncology

## 2018-06-17 NOTE — Telephone Encounter (Signed)
New oncology referral received from Rubbie Battiest, NP from [facility] for DCIS. Cld the pt and scheduled her to see Dr. Lindi Adie on 9/30 at 230pm. Pt aware to arrive 30 minutes early.

## 2018-06-29 ENCOUNTER — Telehealth: Payer: Self-pay | Admitting: Hematology and Oncology

## 2018-06-29 ENCOUNTER — Inpatient Hospital Stay: Payer: BLUE CROSS/BLUE SHIELD | Attending: Hematology and Oncology | Admitting: Hematology and Oncology

## 2018-06-29 DIAGNOSIS — Z803 Family history of malignant neoplasm of breast: Secondary | ICD-10-CM | POA: Diagnosis not present

## 2018-06-29 DIAGNOSIS — Z170 Estrogen receptor positive status [ER+]: Secondary | ICD-10-CM | POA: Diagnosis not present

## 2018-06-29 DIAGNOSIS — D472 Monoclonal gammopathy: Secondary | ICD-10-CM | POA: Insufficient documentation

## 2018-06-29 DIAGNOSIS — D0512 Intraductal carcinoma in situ of left breast: Secondary | ICD-10-CM | POA: Diagnosis present

## 2018-06-29 MED ORDER — ANASTROZOLE 1 MG PO TABS: 1.0000 mg | ORAL_TABLET | Freq: Every day | ORAL | 3 refills | Status: DC

## 2018-06-29 NOTE — Progress Notes (Signed)
[facility] CONSULT NOTE  Patient Care Team: Patient, No Pcp Per as PCP - General (General Practice)  CHIEF COMPLAINTS/PURPOSE OF CONSULTATION:  Left breast DCIS  HISTORY OF PRESENTING ILLNESS:  Alexandra Robinson 62 y.o. female is here because of recent diagnosis of left breast DCIS.  Patient had a screening mammogram that detected left breast abnormalities and calcifications.  She underwent biopsy proven to be intermediate grade DCIS that was ER PR positive.  She met with the surgery who recommended MRI of the breast.  This was performed and she had 10 cm area of non-mass enhancement.  Additional biopsies were requested.  She decided not to undergo surgery and wanted to take antiestrogen therapy alone.  So she was referred to [place] for further evaluation.  I reviewed her records extensively and collaborated the history with the patient.    MEDICAL HISTORY: No past medical problems  SURGICAL HISTORY: Past Surgical History:  Procedure Laterality Date  . APPENDECTOMY    . BREAST CYST EXCISION      SOCIAL HISTORY: Denies any tobacco alcohol or recreational drug use FAMILY HISTORY: Family History  Problem Relation Age of Onset  . Breast cancer Mother        late 50s     ALLERGIES:  has No Known Allergies.  MEDICATIONS:  Current Outpatient Medications  Medication Sig Dispense Refill  . albuterol (PROVENTIL HFA;VENTOLIN HFA) 108 (90 Base) MCG/ACT inhaler Inhale 2 puffs into the lungs every 4 (four) hours as needed for wheezing or shortness of breath. 1 Inhaler 1  . anastrozole (ARIMIDEX) 1 MG tablet Take 1 tablet (1 mg total) by mouth daily. 90 tablet 3  . naproxen (NAPROSYN) 500 MG tablet Take 1 tablet (500 mg total) by mouth 2 (two) times daily. 30 tablet 0   No current facility-administered medications for this visit.     REVIEW OF SYSTEMS:   Constitutional: Denies fevers, chills or abnormal night sweats Eyes: Denies blurriness of vision, double vision or watery  eyes Ears, nose, mouth, throat, and face: Denies mucositis or sore throat Respiratory: Denies cough, dyspnea or wheezes Cardiovascular: Denies palpitation, chest discomfort or lower extremity swelling Gastrointestinal:  Denies nausea, heartburn or change in bowel habits Skin: Denies abnormal skin rashes Lymphatics: Denies new lymphadenopathy or easy bruising Neurological:Denies numbness, tingling or new weaknesses Behavioral/Psych: Mood is stable, no new changes  Breast:  Denies any palpable lumps or discharge All other systems were reviewed with the patient and are negative.  PHYSICAL EXAMINATION: ECOG PERFORMANCE STATUS: 0 - Asymptomatic  Vitals:   06/29/18 1439  BP: 132/72  Pulse: 76  Resp: 17  Temp: 98.4 F (36.9 C)  SpO2: 99%   Filed Weights   06/29/18 1439  Weight: 283 lb 8 oz (128.6 kg)    GENERAL:alert, no distress and comfortable SKIN: skin color, texture, turgor are normal, no rashes or significant lesions EYES: normal, conjunctiva are pink and non-injected, sclera clear OROPHARYNX:no exudate, no erythema and lips, buccal mucosa, and tongue normal  NECK: supple, thyroid normal size, non-tender, without nodularity LYMPH:  no palpable lymphadenopathy in the cervical, axillary or inguinal LUNGS: clear to auscultation and percussion with normal breathing effort HEART: regular rate & rhythm and no murmurs and no lower extremity edema ABDOMEN:abdomen soft, non-tender and normal bowel sounds Musculoskeletal:no cyanosis of digits and no clubbing  PSYCH: alert & oriented x 3 with fluent speech NEURO: no focal motor/sensory deficits BREAST: No palpable nodules in breast. No palpable axillary or supraclavicular lymphadenopathy (exam  performed in the presence of a chaperone)   LABORATORY DATA:  I have reviewed the data as listed No results found for: WBC, HGB, HCT, MCV, PLT No results found for: NA, K, CL, CO2  RADIOGRAPHIC STUDIES: I have personally reviewed the  radiological reports and agreed with the findings in the report.  ASSESSMENT AND PLAN:  MGUS (monoclonal gammopathy of unknown significance)    Ductal carcinoma in situ (DCIS) of left breast Breast MRI 02/25/2018: Linear oriented non-mass enhancement 10 cm diameter Intermediate grade DCIS ER 100%, PR 100%  Patient refused surgery  Pathology review: I discussed with the patient the difference between DCIS and invasive breast cancer. It is considered a precancerous lesion. DCIS is classified as a 0. It is generally detected through mammograms as calcifications. We discussed the significance of grades and its impact on prognosis. We also discussed the importance of ER and PR receptors and their implications to adjuvant treatment options. Prognosis of DCIS dependence on grade, comedo necrosis. It is anticipated that if not treated, 20-30% of DCIS can develop into invasive breast cancer.  Recommendation: Since patient refused surgery, I recommended starting her on antiestrogen therapy.  I sent a prescription for anastrozole 1 mg p.o. Daily.  She will require for surveillance with mammograms every 6 months.  She tells me that she has a mammogram in December to be done at [facility].  Subsequently she wants to have mammograms done here.  Return to clinic in 3 months for follow-up to assess tolerability and toxicities of antiestrogen therapy.   All questions were answered. The patient knows to call the clinic with any problems, questions or concerns.    Harriette Ohara, MD 06/29/18

## 2018-06-29 NOTE — Assessment & Plan Note (Signed)
Breast MRI 02/25/2018: Linear oriented non-mass enhancement 10 cm diameter Intermediate grade DCIS ER 100%, PR 100%  Patient refused surgery  Pathology review: I discussed with the patient the difference between DCIS and invasive breast cancer. It is considered a precancerous lesion. DCIS is classified as a 0. It is generally detected through mammograms as calcifications. We discussed the significance of grades and its impact on prognosis. We also discussed the importance of ER and PR receptors and their implications to adjuvant treatment options. Prognosis of DCIS dependence on grade, comedo necrosis. It is anticipated that if not treated, 20-30% of DCIS can develop into invasive breast cancer.  Recommendation: Since patient refused surgery, I recommended starting her on antiestrogen therapy.  I sent a prescription for anastrozole 1 mg p.o. Daily.  She will require for surveillance with mammograms every 6 months.  She tells me that she has a mammogram in December to be done at [facility].  Subsequently she wants to have mammograms done here.  Return to clinic in 3 months for follow-up to assess tolerability and toxicities of antiestrogen therapy.

## 2018-07-05 ENCOUNTER — Ambulatory Visit (HOSPITAL_COMMUNITY)
Admission: EM | Admit: 2018-07-05 | Discharge: 2018-07-05 | Disposition: A | Discharge status: Home / Self Care | Payer: BLUE CROSS/BLUE SHIELD | Attending: Internal Medicine | Admitting: Internal Medicine

## 2018-07-05 ENCOUNTER — Encounter (HOSPITAL_COMMUNITY): Payer: Self-pay | Admitting: Emergency Medicine

## 2018-07-05 DIAGNOSIS — H81392 Other peripheral vertigo, left ear: Secondary | ICD-10-CM

## 2018-07-05 DIAGNOSIS — R42 Dizziness and giddiness: Secondary | ICD-10-CM

## 2018-07-05 DIAGNOSIS — H6123 Impacted cerumen, bilateral: Secondary | ICD-10-CM

## 2018-07-05 MED ORDER — MECLIZINE HCL 12.5 MG PO TABS: 12.5000 mg | ORAL_TABLET | Freq: Three times a day (TID) | ORAL | 0 refills | Status: DC | PRN

## 2018-07-05 NOTE — ED Triage Notes (Signed)
Pt here with dizziness worse with position change; pt sts left ear congestion; pt sts hx of vertigo

## 2018-07-05 NOTE — Discharge Instructions (Signed)
Please perform Epley maneuver and logroll/barbecue maneuver to help reposition stones Please use meclizine as needed for nausea and dizziness, this medicine causes drowsiness, do not drive after taking Please drink plenty of fluids If symptoms persisting, please follow-up with ear nose and throat, contact information below Please return here emergency room if developing worsening symptoms, weakness, difficulty speaking, chest pain, shortness of breath, changes in vision

## 2018-07-06 NOTE — ED Provider Notes (Signed)
[place]    CSN: [id number] Arrival date & time: 07/05/18  1453     History   Chief Complaint Chief Complaint  Patient presents with  . Dizziness    HPI Alexandra Robinson is a 62 y.o. female no significant past medical history presenting today for evaluation of dizziness and left ear fullness.  Patient states that over the past 3 to 4 weeks she has had worsening dizziness.  She states she has a history of vertigo and that this feels very similar.  Whenever she turns her head towards the left she becomes nauseous and feels a spinning sensation as if she was on a merry-go-round.  She has been trying to avoid turning to the left or lying on her left side.  She is concerned as her symptoms have not been improving.  She denies ever seeing ENT previously for her vertigo.  She has also felt a fullness in her left ear with slight discomfort.  Denies fevers, vision changes, weakness, difficulty speaking, chest pain or shortness of breath.  Denies sensation of syncope.  HPI  History reviewed. No pertinent past medical history.  Patient Active Problem List   Diagnosis Date Noted  . Ductal carcinoma in situ (DCIS) of left breast 06/29/2018    Past Surgical History:  Procedure Laterality Date  . APPENDECTOMY    . BREAST CYST EXCISION      OB History   None      Home Medications    Prior to Admission medications   Medication Sig Start Date End Date Taking? Authorizing Provider  albuterol (PROVENTIL HFA;VENTOLIN HFA) 108 (90 Base) MCG/ACT inhaler Inhale 2 puffs into the lungs every 4 (four) hours as needed for wheezing or shortness of breath. 02/12/16   Konrad Felix, PA  anastrozole (ARIMIDEX) 1 MG tablet Take 1 tablet (1 mg total) by mouth daily. 06/29/18   Nicholas Lose, MD  meclizine (ANTIVERT) 12.5 MG tablet Take 1 tablet (12.5 mg total) by mouth 3 (three) times daily as needed for dizziness. 07/05/18   Jakhi Dishman C, PA-C  naproxen (NAPROSYN) 500 MG tablet Take 1  tablet (500 mg total) by mouth 2 (two) times daily. 09/26/17   Phebe Colla, PA-C    Family History Family History  Problem Relation Age of Onset  . Breast cancer Mother        late 30s     Social History Social History   Tobacco Use  . Smoking status: Never Smoker  Substance Use Topics  . Alcohol use: No  . Drug use: Not on file     Allergies   Patient has no known allergies.   Review of Systems Review of Systems  Constitutional: Negative for fatigue and fever.  HENT: Positive for ear pain. Negative for congestion, sinus pressure and sore throat.   Eyes: Negative for photophobia, pain and visual disturbance.  Respiratory: Negative for cough and shortness of breath.   Cardiovascular: Negative for chest pain.  Gastrointestinal: Positive for nausea. Negative for abdominal pain and vomiting.  Genitourinary: Negative for decreased urine volume and hematuria.  Musculoskeletal: Negative for myalgias, neck pain and neck stiffness.  Neurological: Positive for dizziness. Negative for syncope, facial asymmetry, speech difficulty, weakness, light-headedness, numbness and headaches.     Physical Exam Triage Vital Signs ED Triage Vitals  Enc Vitals Group     BP 07/05/18 1511 (!) 126/99     Pulse Rate 07/05/18 1511 72     Resp 07/05/18 1511 18  Temp 07/05/18 1511 97.9 F (36.6 C)     Temp Source 07/05/18 1511 Oral     SpO2 07/05/18 1511 99 %     Weight --      Height --      Head Circumference --      Peak Flow --      Pain Score 07/05/18 1512 0     Pain Loc --      Pain Edu? --      Excl. in [place]? --    No data found.  Updated Vital Signs BP (!) 126/99 (BP Location: Right Arm)   Pulse 72   Temp 97.9 F (36.6 C) (Oral)   Resp 18   SpO2 99%   Visual Acuity Right Eye Distance:   Left Eye Distance:   Bilateral Distance:    Right Eye Near:   Left Eye Near:    Bilateral Near:     Physical Exam  Constitutional: She is oriented to person, place, and time.  She appears well-developed and well-nourished. No distress.  HENT:  Head: Normocephalic and atraumatic.  Bilateral TMs impacted with cerumen  Oral mucosa pink and moist, no tonsillar enlargement or exudate. Posterior pharynx patent and nonerythematous, no uvula deviation or swelling. Normal phonation.  Eyes: Pupils are equal, round, and reactive to light. Conjunctivae and EOM are normal.  No photophobia  Neck: Normal range of motion. Neck supple.  Cardiovascular: Normal rate and regular rhythm.  No murmur heard. Pulmonary/Chest: Effort normal and breath sounds normal. No respiratory distress.  Abdominal: Soft. There is no tenderness.  Musculoskeletal: She exhibits no edema.  Neurological: She is alert and oriented to person, place, and time.  Patient A&O x3, cranial nerves II-XII grossly intact, strength at shoulders, hips and knees 5/5, equal bilaterally, patellar reflex 2+ bilaterally. Normal Finger to nose, RAM and heel to shin. Negative Romberg and Pronator Drift. Gait without abnormality. Patient had reproduction of her dizziness symptoms with Dix-Hallpike towards the left, no nystagmus appreciated.   Skin: Skin is warm and dry.  Psychiatric: She has a normal mood and affect.  Nursing note and vitals reviewed.    UC Treatments / Results  Labs (all labs ordered are listed, but only abnormal results are displayed) Labs Reviewed - No data to display  EKG None  Radiology No results found.  Procedures Procedures (including critical care time)  Medications Ordered in UC Medications - No data to display  Initial Impression / Assessment and Plan / UC Course  I have reviewed the triage vital signs and the nursing notes.  Pertinent labs & imaging results that were available during my care of the patient were reviewed by me and considered in my medical decision making (see chart for details).     Patient with dizziness, likely secondary to BPPV, vertigo seems peripheral.  No  neuro deficits, vital signs stable.  Irrigation performed to remove cerumen impaction.  Discussed performing Epley maneuver and logroll/barbecue maneuver to help move stones back in place in order to help decrease intensity and frequency of symptoms.  Follow-up with ENT if symptoms not improving.  Meclizine as needed for nausea and dizziness, discussed drowsiness regarding meclizine advised not to drive, operate machinery or work while using.Discussed strict return precautions. Patient verbalized understanding and is agreeable with plan.  Final Clinical Impressions(s) / UC Diagnoses   Final diagnoses:  Dizziness  Peripheral vertigo involving left ear  Bilateral impacted cerumen     Discharge Instructions     Please  perform Epley maneuver and logroll/barbecue maneuver to help reposition stones Please use meclizine as needed for nausea and dizziness, this medicine causes drowsiness, do not drive after taking Please drink plenty of fluids If symptoms persisting, please follow-up with ear nose and throat, contact information below Please return here emergency room if developing worsening symptoms, weakness, difficulty speaking, chest pain, shortness of breath, changes in vision   ED Prescriptions    Medication Sig Dispense Auth. Provider   meclizine (ANTIVERT) 12.5 MG tablet Take 1 tablet (12.5 mg total) by mouth 3 (three) times daily as needed for dizziness. 30 tablet Meha Vidrine, Jovista C, PA-C     Controlled Substance Prescriptions [place] Controlled Substance Registry consulted? Not Applicable   Janith Lima, [place] 07/06/18 1102

## 2018-08-03 ENCOUNTER — Encounter (HOSPITAL_COMMUNITY): Payer: Self-pay | Admitting: *Deleted

## 2018-08-03 ENCOUNTER — Ambulatory Visit (HOSPITAL_COMMUNITY)
Admission: EM | Admit: 2018-08-03 | Discharge: 2018-08-03 | Disposition: A | Discharge status: Home / Self Care | Payer: BLUE CROSS/BLUE SHIELD | Attending: Family Medicine | Admitting: Family Medicine

## 2018-08-03 ENCOUNTER — Ambulatory Visit (INDEPENDENT_AMBULATORY_CARE_PROVIDER_SITE_OTHER): Payer: BLUE CROSS/BLUE SHIELD

## 2018-08-03 ENCOUNTER — Other Ambulatory Visit: Payer: Self-pay

## 2018-08-03 DIAGNOSIS — J189 Pneumonia, unspecified organism: Secondary | ICD-10-CM

## 2018-08-03 MED ORDER — AZITHROMYCIN 250 MG PO TABS: 250.0000 mg | ORAL_TABLET | Freq: Every day | ORAL | 0 refills | Status: DC

## 2018-08-03 NOTE — ED Triage Notes (Signed)
C/o cough and cold sx for 2-3 weeks.

## 2018-08-03 NOTE — Discharge Instructions (Addendum)
Your x ray is concerning for pneumonia.  We will go ahead and treat with z pac today.  You will need a repeat x ray in 4 to 6 weeks.  If you develop worsening symptoms sooner please call your doctor or go to the ER.

## 2018-08-03 NOTE — ED Provider Notes (Signed)
Alexandra Robinson    CSN: [id number] Arrival date & time: 08/03/18  1348     History   Chief Complaint Chief Complaint  Patient presents with  . Cough    HPI Alexandra Robinson is a 62 y.o. female.   Patient is a 62 year old female with past medical history of ductal carcinoma in situ that presents with 4 weeks of cough, congestion.  Her symptoms have been waxing and waning.  Initially the cough was productive with green mucus but now the cough is more dry.  She has had some fatigue, mild body aches and low-grade fever at times.  She has taken multiple over-the-counter medications for her symptoms.  She has had some chest pain/rib pain with coughing.  She does have positive sick contacts.  No recent traveling.  She denies any shortness of breath or palpitations.  She does not smoke.  ROS per HPI      History reviewed. No pertinent past medical history.  Patient Active Problem List   Diagnosis Date Noted  . Ductal carcinoma in situ (DCIS) of left breast 06/29/2018    Past Surgical History:  Procedure Laterality Date  . APPENDECTOMY    . BREAST CYST EXCISION      OB History   None      Home Medications    Prior to Admission medications   Medication Sig Start Date End Date Taking? Authorizing Provider  albuterol (PROVENTIL HFA;VENTOLIN HFA) 108 (90 Base) MCG/ACT inhaler Inhale 2 puffs into the lungs every 4 (four) hours as needed for wheezing or shortness of breath. 02/12/16   Konrad Felix, PA  anastrozole (ARIMIDEX) 1 MG tablet Take 1 tablet (1 mg total) by mouth daily. 06/29/18   Nicholas Lose, MD  azithromycin (ZITHROMAX) 250 MG tablet Take 1 tablet (250 mg total) by mouth daily. Take first 2 tablets together, then 1 every day until finished. 08/03/18   Loura Halt A, NP  meclizine (ANTIVERT) 12.5 MG tablet Take 1 tablet (12.5 mg total) by mouth 3 (three) times daily as needed for dizziness. 07/05/18   Wieters, Hallie C, PA-C  naproxen (NAPROSYN) 500 MG tablet  Take 1 tablet (500 mg total) by mouth 2 (two) times daily. 09/26/17   Phebe Colla, PA-C    Family History Family History  Problem Relation Age of Onset  . Breast cancer Mother        late 74s     Social History Social History   Tobacco Use  . Smoking status: Never Smoker  . Smokeless tobacco: Never Used  Substance Use Topics  . Alcohol use: No  . Drug use: Not on file     Allergies   Bactrim [sulfamethoxazole-trimethoprim]   Review of Systems Review of Systems   Physical Exam Triage Vital Signs ED Triage Vitals [08/03/18 1500]  Enc Vitals Group     BP 127/77     Pulse Rate 81     Resp 16     Temp 98.2 F (36.8 C)     Temp Source Oral     SpO2 99 %     Weight      Height      Head Circumference      Peak Flow      Pain Score 0     Pain Loc      Pain Edu?      Excl. in [place]?    No data found.  Updated Vital Signs BP 127/77 (BP Location: Right Arm)  Pulse 81   Temp 98.2 F (36.8 C) (Oral)   Resp 16   SpO2 99%   Visual Acuity Right Eye Distance:   Left Eye Distance:   Bilateral Distance:    Right Eye Near:   Left Eye Near:    Bilateral Near:     Physical Exam  Constitutional: She appears well-developed and well-nourished.  Very pleasant. Non toxic or ill appearing.   HENT:  Head: Normocephalic and atraumatic.  Right Ear: External ear normal.  Left Ear: External ear normal.  Bilateral TMs normal.  External ears normal.  Without posterior oropharyngeal erythema, tonsillar swelling or exudates. No lesions.  No lymphadenopathy.    Eyes: Conjunctivae are normal.  Neck: Normal range of motion.  Cardiovascular: Normal rate, regular rhythm and normal heart sounds.  Pulmonary/Chest: Effort normal.  Decreased lung sounds in the bases. No wheezing, rhonchi or crackles. No respiratory distress or dyspnea.   Abdominal: Soft.  Musculoskeletal: Normal range of motion.  Lymphadenopathy:    She has no cervical adenopathy.  Neurological: She is  alert.  Skin: Skin is warm and dry.  Psychiatric: She has a normal mood and affect.  Nursing note and vitals reviewed.    UC Treatments / Results  Labs (all labs ordered are listed, but only abnormal results are displayed) Labs Reviewed - No data to display  EKG None  Radiology Dg Chest 2 View  Result Date: 08/03/2018 CLINICAL DATA:  Cough for 4 weeks EXAM: CHEST - 2 VIEW COMPARISON:  02/12/2016 chest radiograph. FINDINGS: Stable cardiomediastinal silhouette with normal heart size. No pneumothorax. No pleural effusion. Hazy reticulonodular opacities in the lower lobes. IMPRESSION: Hazy reticulonodular opacities in the lower lobes, cannot exclude bronchopneumonia or aspiration. Recommend follow-up PA and lateral post treatment chest radiographs in 4-6 weeks. Electronically Signed   By: Ilona Sorrel M.D.   On: 08/03/2018 16:04    Procedures Procedures (including critical care time)  Medications Ordered in UC Medications - No data to display  Initial Impression / Assessment and Plan / UC Course  I have reviewed the triage vital signs and the nursing notes.  Pertinent labs & imaging results that were available during my care of the patient were reviewed by me and considered in my medical decision making (see chart for details).     Patient is a 62 year old female with past medical history of cancer that presents with 4 weeks of cough, congestion.  She is concerned because the cough has not went away.  X-ray revealed bronchopneumonia versus aspiration pneumonia. We will go ahead and treat for pneumonia with azithromycin. Instructed that she needs to have follow-up chest x-ray within 4 to 6 weeks. For continued or worsening symptoms she will need to go to the ER. Patient understanding and agreeable to plan.  Final Clinical Impressions(s) / UC Diagnoses   Final diagnoses:  Community acquired pneumonia, unspecified laterality     Discharge Instructions     Your x ray is  concerning for pneumonia.  We will go ahead and treat with z pac today.  You will need a repeat x ray in 4 to 6 weeks.  If you develop worsening symptoms sooner please call your doctor or go to the ER.     ED Prescriptions    Medication Sig Dispense Auth. Provider   azithromycin (ZITHROMAX) 250 MG tablet Take 1 tablet (250 mg total) by mouth daily. Take first 2 tablets together, then 1 every day until finished. 6 tablet Orvan July, NP  Controlled Substance Prescriptions [place] Controlled Substance Registry consulted? Not Applicable   Orvan July, NP 08/04/18 [phone]

## 2018-09-28 ENCOUNTER — Inpatient Hospital Stay: Payer: BLUE CROSS/BLUE SHIELD | Attending: Hematology and Oncology | Admitting: Hematology and Oncology

## 2018-09-28 NOTE — Assessment & Plan Note (Signed)
Breast MRI 02/25/2018: Linear oriented non-mass enhancement 10 cm diameter Intermediate grade DCIS ER 100%, PR 100%  Patient refused surgery Current treatment: Oral antiestrogen therapy with anastrozole 1 mg daily started 06/29/2018 Anastrozole toxicities:  Surveillance: 1.  Mammogram: Needs to be done every 6 months: Supposedly patient is to have a mammogram at [facility] but I do not see the results. 2.  Breast exam 09/28/2018: Benign  Return to clinic in 6 months with a mammogram and follow-up

## 2018-11-25 ENCOUNTER — Encounter (HOSPITAL_COMMUNITY): Payer: Self-pay

## 2018-11-25 ENCOUNTER — Ambulatory Visit (HOSPITAL_COMMUNITY)
Admission: EM | Admit: 2018-11-25 | Discharge: 2018-11-25 | Disposition: A | Discharge status: Home / Self Care | Payer: Medicare Other | Attending: Family Medicine | Admitting: Family Medicine

## 2018-11-25 DIAGNOSIS — R6889 Other general symptoms and signs: Secondary | ICD-10-CM | POA: Diagnosis not present

## 2018-11-25 MED ORDER — FLUTICASONE PROPIONATE 50 MCG/ACT NA SUSP: 2.0000 | Freq: Every day | NASAL | 0 refills | Status: DC

## 2018-11-25 MED ORDER — CETIRIZINE HCL 5 MG PO CHEW: 5.0000 mg | CHEWABLE_TABLET | Freq: Every day | ORAL | 0 refills | Status: DC

## 2018-11-25 MED ORDER — BENZONATATE 100 MG PO CAPS: 100.0000 mg | ORAL_CAPSULE | Freq: Three times a day (TID) | ORAL | 0 refills | Status: DC

## 2018-11-25 MED ORDER — OSELTAMIVIR PHOSPHATE 75 MG PO CAPS: 75.0000 mg | ORAL_CAPSULE | Freq: Two times a day (BID) | ORAL | 0 refills | Status: DC

## 2018-11-25 NOTE — Discharge Instructions (Signed)
Get plenty of rest and push fluids.  Drink at least half your body weight in ounces.  You may supplement with OTC Pedialyte or oral rehydration solution Tamiflu prescribed.  Take as directed and to completion Zyrtec and flonase prescribed for congestion and runny nose Tessalon Perles prescribed for cough Use OTC tylenol every 4 hours or ibuprofen every 6 hours for fever, body aches and chills Follow up with PCP if symptoms persist.  You may follow up with Molli Barrows FNP if you do not already have a PCP Go to the ED if you have any new or worsening symptoms fever that does not moderate with tylenol, chills, nausea, vomiting, chest pain, worsening cough, shortness of breath, wheezing, abdominal pain, changes in bowel or bladder habits, etc..Marland Kitchen

## 2018-11-25 NOTE — ED Provider Notes (Signed)
[place]   [id number] 11/25/18 Arrival Time: 1245   CC: URI symptoms   SUBJECTIVE: History from: patient.  Alexandra Robinson is a 63 y.o. female who presents with abrupt onset of nasal congestion, runny nose, sore throat, productive cough, body aches, chills, nausea, and body aches x 1 day.  Admits to sick exposure to granddaughters with the flu.  Has tried OTC medications with relief.  Reports previous symptoms in the past.  Complains of subjective fever.  Denies sinus pain, SOB, wheezing, chest pain, changes in bowel or bladder habits.    Received flu shot this year: no. ROS: As per HPI.  History reviewed. No pertinent past medical history. Past Surgical History:  Procedure Laterality Date  . APPENDECTOMY    . BREAST CYST EXCISION     Allergies  Allergen Reactions  . Bactrim [Sulfamethoxazole-Trimethoprim] Hives   No current facility-administered medications on file prior to encounter.    Current Outpatient Medications on File Prior to Encounter  Medication Sig Dispense Refill  . albuterol (PROVENTIL HFA;VENTOLIN HFA) 108 (90 Base) MCG/ACT inhaler Inhale 2 puffs into the lungs every 4 (four) hours as needed for wheezing or shortness of breath. 1 Inhaler 1  . anastrozole (ARIMIDEX) 1 MG tablet Take 1 tablet (1 mg total) by mouth daily. 90 tablet 3  . meclizine (ANTIVERT) 12.5 MG tablet Take 1 tablet (12.5 mg total) by mouth 3 (three) times daily as needed for dizziness. 30 tablet 0  . naproxen (NAPROSYN) 500 MG tablet Take 1 tablet (500 mg total) by mouth 2 (two) times daily. 30 tablet 0   Social History   Socioeconomic History  . Marital status: Widowed    Spouse name: Not on file  . Number of children: Not on file  . Years of education: Not on file  . Highest education level: Not on file  Occupational History  . Not on file  Social Needs  . Financial resource strain: Not on file  . Food insecurity:    Worry: Not on file    Inability: Not on file  .  Transportation needs:    Medical: Not on file    Non-medical: Not on file  Tobacco Use  . Smoking status: Never Smoker  . Smokeless tobacco: Never Used  Substance and Sexual Activity  . Alcohol use: No  . Drug use: Not on file  . Sexual activity: Not on file  Lifestyle  . Physical activity:    Days per week: Not on file    Minutes per session: Not on file  . Stress: Not on file  Relationships  . Social connections:    Talks on phone: Not on file    Gets together: Not on file    Attends religious service: Not on file    Active member of club or organization: Not on file    Attends meetings of clubs or organizations: Not on file    Relationship status: Not on file  . Intimate partner violence:    Fear of current or ex partner: Not on file    Emotionally abused: Not on file    Physically abused: Not on file    Forced sexual activity: Not on file  Other Topics Concern  . Not on file  Social History Narrative  . Not on file   Family History  Problem Relation Age of Onset  . Breast cancer Mother        late 29s     OBJECTIVE:  Vitals:  11/25/18 1024  BP: 129/70  Pulse: 92  Resp: 17  Temp: 98.8 F (37.1 C)  TempSrc: Oral  SpO2: 100%    General appearance: alert; appears fatigued, but nontoxic; speaking in full sentences and tolerating own secretions HEENT: NCAT; Ears: EACs clear, TMs pearly gray; Eyes: PERRL.  EOM grossly intact. Nose: nares patent without rhinorrhea, Throat: oropharynx clear, tonsils non erythematous or enlarged, uvula midline  Neck: supple without LAD Lungs: unlabored respirations, symmetrical air entry; cough: mild; no respiratory distress; CTAB Heart: regular rate and rhythm.  Radial pulses 2+ symmetrical bilaterally Skin: warm and dry Psychological: alert and cooperative; normal mood and affect  ASSESSMENT & PLAN:  1. Flu-like symptoms     Meds ordered this encounter  Medications  . oseltamivir (TAMIFLU) 75 MG capsule    Sig: Take 1  capsule (75 mg total) by mouth every 12 (twelve) hours.    Dispense:  10 capsule    Refill:  0    Order Specific Question:   Supervising Provider    Answer:   Raylene Everts [[id number]]  . fluticasone (FLONASE) 50 MCG/ACT nasal spray    Sig: Place 2 sprays into both nostrils daily.    Dispense:  16 g    Refill:  0    Order Specific Question:   Supervising Provider    Answer:   Raylene Everts [[id number]]  . benzonatate (TESSALON) 100 MG capsule    Sig: Take 1 capsule (100 mg total) by mouth every 8 (eight) hours.    Dispense:  21 capsule    Refill:  0    Order Specific Question:   Supervising Provider    Answer:   Raylene Everts [[id number]]  . cetirizine (ZYRTEC) 5 MG chewable tablet    Sig: Chew 1 tablet (5 mg total) by mouth daily.    Dispense:  20 tablet    Refill:  0    Order Specific Question:   Supervising Provider    Answer:   Raylene Everts [[id number]]   Get plenty of rest and push fluids.  Drink at least half your body weight in ounces.  You may supplement with OTC Pedialyte or oral rehydration solution Tamiflu prescribed.  Take as directed and to completion Zyrtec and flonase prescribed for congestion and runny nose Tessalon Perles prescribed for cough Use OTC tylenol every 4 hours or ibuprofen every 6 hours for fever, body aches and chills Follow up with PCP if symptoms persist.  You may follow up with Molli Barrows FNP if you do not already have a PCP Go to the ED if you have any new or worsening symptoms fever that does not moderate with tylenol, chills, nausea, vomiting, chest pain, worsening cough, shortness of breath, wheezing, abdominal pain, changes in bowel or bladder habits, etc...   Reviewed expectations re: course of current medical issues. Questions answered. Outlined signs and symptoms indicating need for more acute intervention. Patient verbalized understanding. After Visit Summary given.         Lestine Box, PA-C 11/25/18 1148

## 2018-11-25 NOTE — ED Triage Notes (Signed)
Pt presents with upper respiratory cold symptoms; non productive cough, nasal drainage, congestion, sore throat, and headache since yesterday.  Pt has been exposed to 2 grandchildren that have tested positive for flu in past 2 weeks.

## 2019-12-08 DIAGNOSIS — I771 Stricture of artery: Secondary | ICD-10-CM | POA: Diagnosis not present

## 2019-12-08 DIAGNOSIS — Z1239 Encounter for other screening for malignant neoplasm of breast: Secondary | ICD-10-CM | POA: Diagnosis not present

## 2019-12-08 DIAGNOSIS — Z1159 Encounter for screening for other viral diseases: Secondary | ICD-10-CM | POA: Diagnosis not present

## 2019-12-08 DIAGNOSIS — Z6841 Body Mass Index (BMI) 40.0 and over, adult: Secondary | ICD-10-CM | POA: Diagnosis not present

## 2019-12-08 DIAGNOSIS — Z0000 Encounter for general adult medical examination without abnormal findings: Secondary | ICD-10-CM | POA: Diagnosis not present

## 2019-12-08 DIAGNOSIS — Z008 Encounter for other general examination: Secondary | ICD-10-CM | POA: Diagnosis not present

## 2019-12-08 DIAGNOSIS — Z79899 Other long term (current) drug therapy: Secondary | ICD-10-CM | POA: Diagnosis not present

## 2019-12-08 DIAGNOSIS — Z7189 Other specified counseling: Secondary | ICD-10-CM | POA: Diagnosis not present

## 2019-12-08 DIAGNOSIS — Z0001 Encounter for general adult medical examination with abnormal findings: Secondary | ICD-10-CM | POA: Diagnosis not present

## 2020-11-03 IMAGING — DX DG CHEST 2V
2 series · 2 of 2 positions shown · non-contrast
Comparison: 02/12/2016 chest radiograph.

CLINICAL DATA: Cough for 4 weeks

EXAM:
CHEST - 2 VIEW

[chest pa]
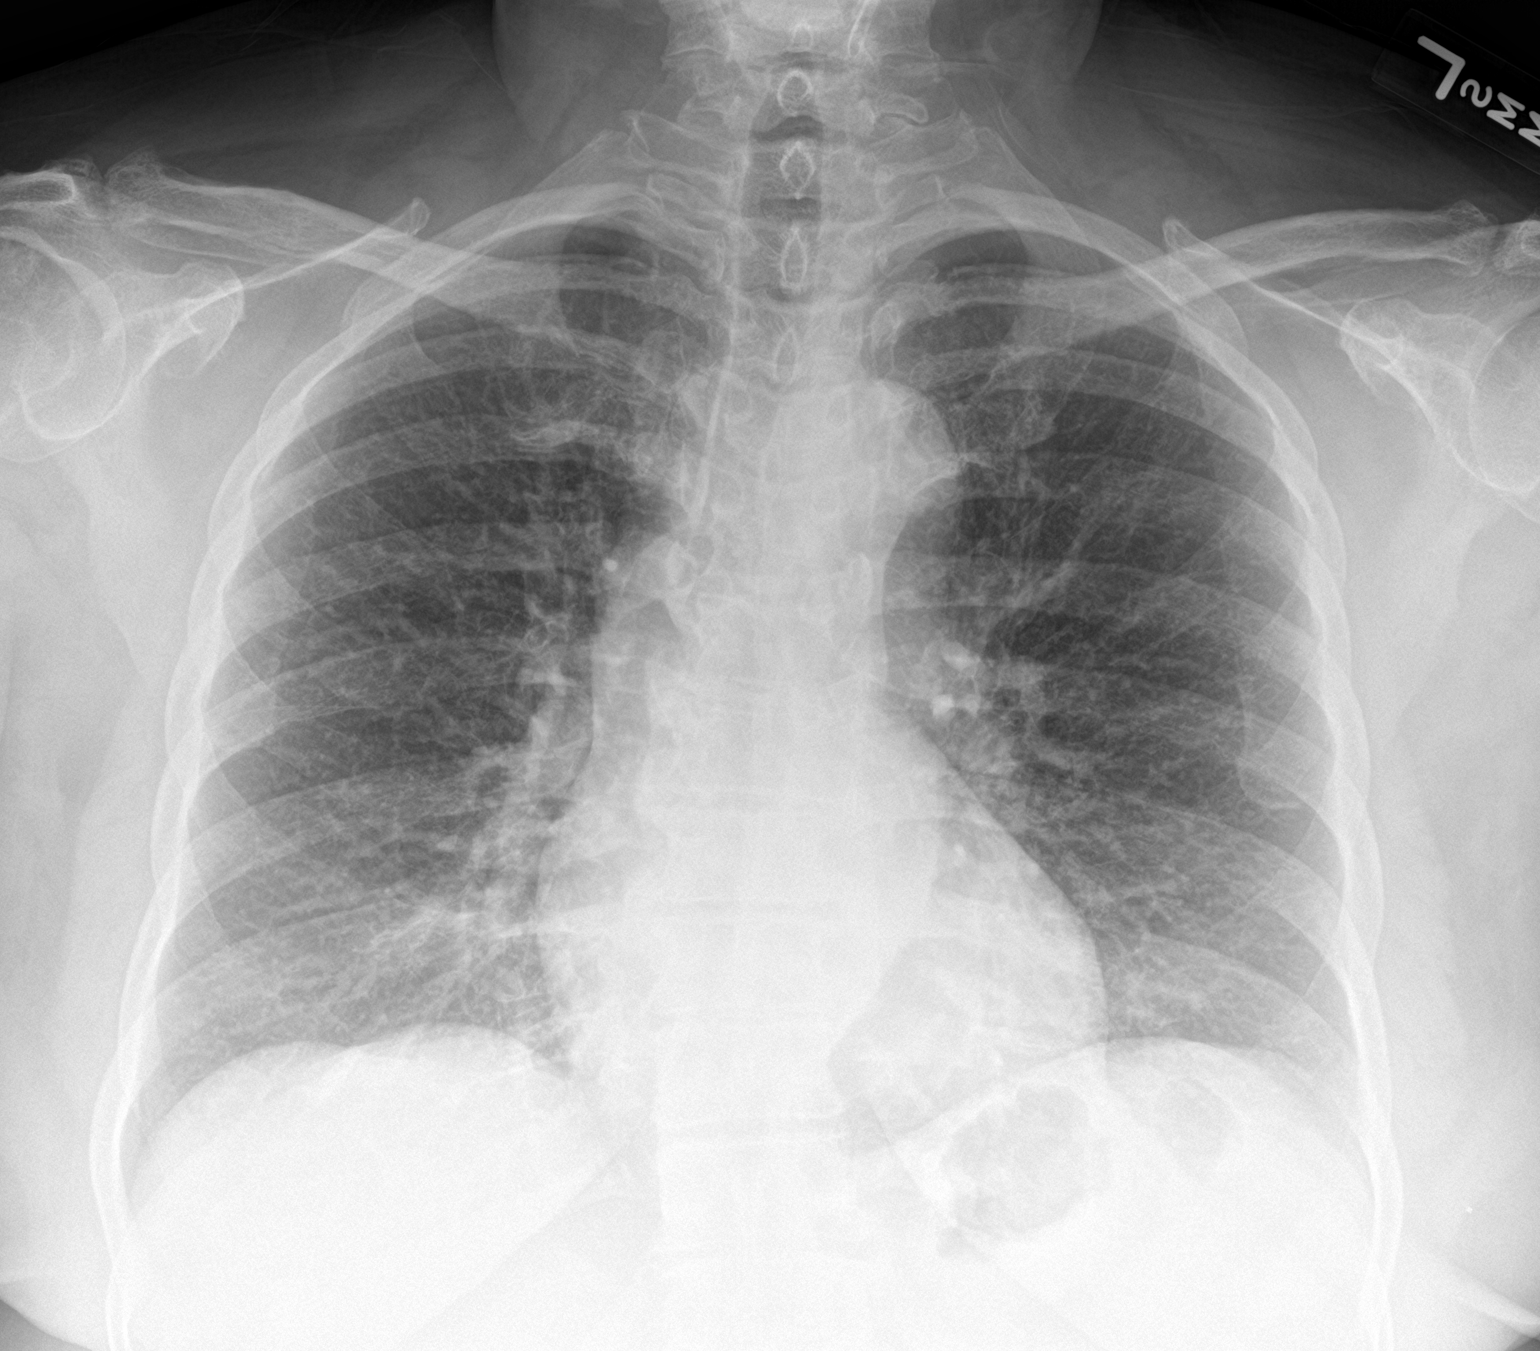

[chest lat]
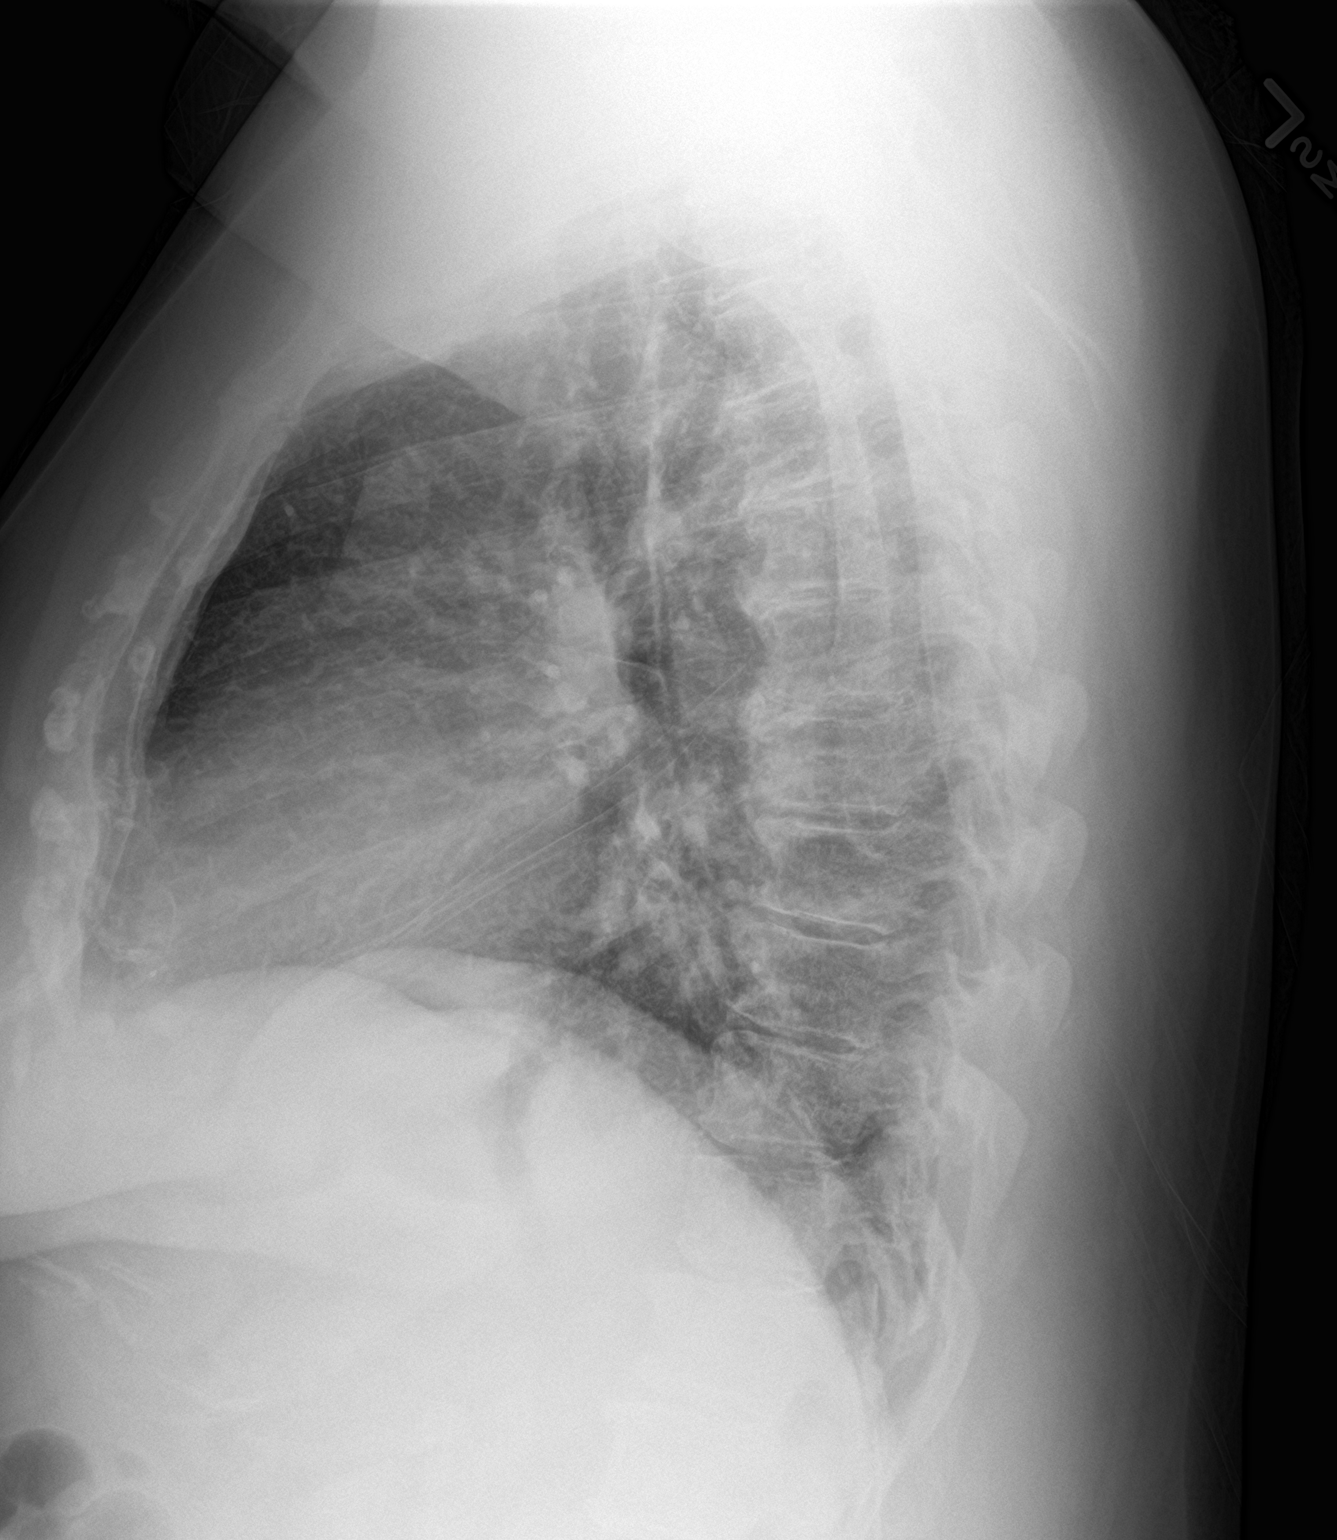

[2 of 2 positions shown; findings below may reference images not displayed]

FINDINGS: Stable cardiomediastinal silhouette with normal heart size. No
pneumothorax. No pleural effusion. Hazy reticulonodular opacities in
the lower lobes.
IMPRESSION: Hazy reticulonodular opacities in the lower lobes, cannot exclude
bronchopneumonia or aspiration. Recommend follow-up PA and lateral
post treatment chest radiographs in 4-6 weeks.

## 2021-07-11 ENCOUNTER — Other Ambulatory Visit: Payer: Self-pay

## 2021-07-11 ENCOUNTER — Ambulatory Visit (INDEPENDENT_AMBULATORY_CARE_PROVIDER_SITE_OTHER): Payer: PPO | Admitting: Internal Medicine

## 2021-07-11 ENCOUNTER — Encounter: Payer: Self-pay | Admitting: Internal Medicine

## 2021-07-11 VITALS — BP 136/84 | HR 75 | Temp 98.6°F | Ht 67.75 in | Wt 286.0 lb

## 2021-07-11 DIAGNOSIS — I10 Essential (primary) hypertension: Secondary | ICD-10-CM | POA: Diagnosis not present

## 2021-07-11 DIAGNOSIS — Z1231 Encounter for screening mammogram for malignant neoplasm of breast: Secondary | ICD-10-CM | POA: Insufficient documentation

## 2021-07-11 DIAGNOSIS — Z23 Encounter for immunization: Secondary | ICD-10-CM | POA: Insufficient documentation

## 2021-07-11 DIAGNOSIS — Z6841 Body Mass Index (BMI) 40.0 and over, adult: Secondary | ICD-10-CM

## 2021-07-11 DIAGNOSIS — E785 Hyperlipidemia, unspecified: Secondary | ICD-10-CM | POA: Diagnosis not present

## 2021-07-11 DIAGNOSIS — R9431 Abnormal electrocardiogram [ECG] [EKG]: Secondary | ICD-10-CM | POA: Insufficient documentation

## 2021-07-11 DIAGNOSIS — E7800 Pure hypercholesterolemia, unspecified: Secondary | ICD-10-CM | POA: Insufficient documentation

## 2021-07-11 DIAGNOSIS — Z114 Encounter for screening for human immunodeficiency virus [HIV]: Secondary | ICD-10-CM | POA: Diagnosis not present

## 2021-07-11 DIAGNOSIS — Z1159 Encounter for screening for other viral diseases: Secondary | ICD-10-CM | POA: Diagnosis not present

## 2021-07-11 DIAGNOSIS — R7303 Prediabetes: Secondary | ICD-10-CM | POA: Insufficient documentation

## 2021-07-11 DIAGNOSIS — Z1211 Encounter for screening for malignant neoplasm of colon: Secondary | ICD-10-CM | POA: Insufficient documentation

## 2021-07-11 DIAGNOSIS — Z0001 Encounter for general adult medical examination with abnormal findings: Secondary | ICD-10-CM | POA: Diagnosis not present

## 2021-07-11 DIAGNOSIS — R0789 Other chest pain: Secondary | ICD-10-CM

## 2021-07-11 HISTORY — DX: Essential (primary) hypertension: I10

## 2021-07-11 LAB — HEPATIC FUNCTION PANEL
ALT: 21 U/L (ref 0–35)
AST: 25 U/L (ref 0–37)
Albumin: 4.2 g/dL (ref 3.5–5.2)
Alkaline Phosphatase: 42 U/L (ref 39–117)
Bilirubin, Direct: 0.1 mg/dL (ref 0.0–0.3)
Total Bilirubin: 0.4 mg/dL (ref 0.2–1.2)
Total Protein: 7.6 g/dL (ref 6.0–8.3)

## 2021-07-11 LAB — URINALYSIS, ROUTINE W REFLEX MICROSCOPIC
Bilirubin Urine: NEGATIVE
Ketones, ur: NEGATIVE
Leukocytes,Ua: NEGATIVE
Nitrite: POSITIVE — AB
Specific Gravity, Urine: 1.02 (ref 1.000–1.030)
Total Protein, Urine: NEGATIVE
Urine Glucose: NEGATIVE
Urobilinogen, UA: 0.2 (ref 0.0–1.0)
pH: 6 (ref 5.0–8.0)

## 2021-07-11 LAB — BASIC METABOLIC PANEL
BUN: 13 mg/dL (ref 6–23)
CO2: 25 mEq/L (ref 19–32)
Calcium: 9.7 mg/dL (ref 8.4–10.5)
Chloride: 104 mEq/L (ref 96–112)
Creatinine, Ser: 0.9 mg/dL (ref 0.40–1.20)
GFR: 66.05 mL/min (ref >60.00)
Glucose, Bld: 97 mg/dL (ref 70–99)
Potassium: 3.9 mEq/L (ref 3.5–5.1)
Sodium: 137 mEq/L (ref 135–145)

## 2021-07-11 LAB — LIPID PANEL
Cholesterol: 269 mg/dL — ABNORMAL HIGH (ref 0–200)
HDL: 62.5 mg/dL (ref >39.00)
LDL Cholesterol: 177 mg/dL — ABNORMAL HIGH (ref 0–99)
NonHDL: 206.26
Total CHOL/HDL Ratio: 4
Triglycerides: 146 mg/dL (ref 0.0–149.0)
VLDL: 29.2 mg/dL (ref 0.0–40.0)

## 2021-07-11 LAB — CBC WITH DIFFERENTIAL/PLATELET
Basophils Absolute: 0 10*3/uL (ref 0.0–0.1)
Basophils Relative: 0.8 % (ref 0.0–3.0)
Eosinophils Absolute: 0.1 10*3/uL (ref 0.0–0.7)
Eosinophils Relative: 2.9 % (ref 0.0–5.0)
HCT: 41.3 % (ref 36.0–46.0)
Hemoglobin: 13.7 g/dL (ref 12.0–15.0)
Lymphocytes Relative: 34.3 % (ref 12.0–46.0)
Lymphs Abs: 1.7 10*3/uL (ref 0.7–4.0)
MCHC: 33.2 g/dL (ref 30.0–36.0)
MCV: 93.4 fl (ref 78.0–100.0)
Monocytes Absolute: 0.5 10*3/uL (ref 0.1–1.0)
Monocytes Relative: 10.6 % (ref 3.0–12.0)
Neutro Abs: 2.5 10*3/uL (ref 1.4–7.7)
Neutrophils Relative %: 51.4 % (ref 43.0–77.0)
Platelets: 252 10*3/uL (ref 150.0–400.0)
RBC: 4.42 Mil/uL (ref 3.87–5.11)
RDW: 13.3 % (ref 11.5–15.5)
WBC: 4.8 10*3/uL (ref 4.0–10.5)

## 2021-07-11 LAB — TSH: TSH: 3.33 u[IU]/mL (ref 0.35–5.50)

## 2021-07-11 LAB — HEMOGLOBIN A1C: Hgb A1c MFr Bld: 6 % (ref 4.6–6.5)

## 2021-07-11 LAB — TROPONIN I (HIGH SENSITIVITY): High Sens Troponin I: 5 ng/L (ref 2–17)

## 2021-07-11 LAB — VITAMIN D 25 HYDROXY (VIT D DEFICIENCY, FRACTURES): VITD: 13.8 ng/mL — ABNORMAL LOW (ref 30.00–100.00)

## 2021-07-11 MED ORDER — SHINGRIX 50 MCG/0.5ML IM SUSR: 0.5000 mL | Freq: Once | INTRAMUSCULAR | 1 refills | Status: AC

## 2021-07-11 MED ORDER — BOOSTRIX 5-2.5-18.5 LF-MCG/0.5 IM SUSP: 0.5000 mL | Freq: Once | INTRAMUSCULAR | 0 refills | Status: AC

## 2021-07-11 MED ORDER — AMLODIPINE BESYLATE 5 MG PO TABS: 5.0000 mg | ORAL_TABLET | Freq: Every day | ORAL | 0 refills | Status: DC

## 2021-07-11 MED ORDER — ROSUVASTATIN CALCIUM 20 MG PO TABS: 20.0000 mg | ORAL_TABLET | Freq: Every day | ORAL | 1 refills | Status: DC

## 2021-07-11 NOTE — Patient Instructions (Signed)
Health Maintenance, Female Adopting a healthy lifestyle and getting preventive care are important in promoting health and wellness. Ask your health care provider about: The right schedule for you to have regular tests and exams. Things you can do on your own to prevent diseases and keep yourself healthy. What should I know about diet, weight, and exercise? Eat a healthy diet  Eat a diet that includes plenty of vegetables, fruits, low-fat dairy products, and lean protein. Do not eat a lot of foods that are high in solid fats, added sugars, or sodium. Maintain a healthy weight Body mass index (BMI) is used to identify weight problems. It estimates body fat based on height and weight. Your health care provider can help determine your BMI and help you achieve or maintain a healthy weight. Get regular exercise Get regular exercise. This is one of the most important things you can do for your health. Most adults should: Exercise for at least 150 minutes each week. The exercise should increase your heart rate and make you sweat (moderate-intensity exercise). Do strengthening exercises at least twice a week. This is in addition to the moderate-intensity exercise. Spend less time sitting. Even light physical activity can be beneficial. Watch cholesterol and blood lipids Have your blood tested for lipids and cholesterol at 67 years of age, then have this test every 5 years. Have your cholesterol levels checked more often if: Your lipid or cholesterol levels are high. You are older than 67 years of age. You are at high risk for heart disease. What should I know about cancer screening? Depending on your health history and family history, you may need to have cancer screening at various ages. This may include screening for: Breast cancer. Cervical cancer. Colorectal cancer. Skin cancer. Lung cancer. What should I know about heart disease, diabetes, and high blood pressure? Blood pressure and heart  disease High blood pressure causes heart disease and increases the risk of stroke. This is more likely to develop in people who have high blood pressure readings, are of African descent, or are overweight. Have your blood pressure checked: Every 3-5 years if you are 18-39 years of age. Every year if you are 40 years old or older. Diabetes Have regular diabetes screenings. This checks your fasting blood sugar level. Have the screening done: Once every three years after age 40 if you are at a normal weight and have a low risk for diabetes. More often and at a younger age if you are overweight or have a high risk for diabetes. What should I know about preventing infection? Hepatitis B If you have a higher risk for hepatitis B, you should be screened for this virus. Talk with your health care provider to find out if you are at risk for hepatitis B infection. Hepatitis C Testing is recommended for: Everyone born from 1945 through 1965. Anyone with known risk factors for hepatitis C. Sexually transmitted infections (STIs) Get screened for STIs, including gonorrhea and chlamydia, if: You are sexually active and are younger than 67 years of age. You are older than 67 years of age and your health care provider tells you that you are at risk for this type of infection. Your sexual activity has changed since you were last screened, and you are at increased risk for chlamydia or gonorrhea. Ask your health care provider if you are at risk. Ask your health care provider about whether you are at high risk for HIV. Your health care provider may recommend a prescription medicine   to help prevent HIV infection. If you choose to take medicine to prevent HIV, you should first get tested for HIV. You should then be tested every 3 months for as long as you are taking the medicine. Pregnancy If you are about to stop having your period (premenopausal) and you may become pregnant, seek counseling before you get  pregnant. Take 400 to 800 micrograms (mcg) of folic acid every day if you become pregnant. Ask for birth control (contraception) if you want to prevent pregnancy. Osteoporosis and menopause Osteoporosis is a disease in which the bones lose minerals and strength with aging. This can result in bone fractures. If you are 65 years old or older, or if you are at risk for osteoporosis and fractures, ask your health care provider if you should: Be screened for bone loss. Take a calcium or vitamin D supplement to lower your risk of fractures. Be given hormone replacement therapy (HRT) to treat symptoms of menopause. Follow these instructions at home: Lifestyle Do not use any products that contain nicotine or tobacco, such as cigarettes, e-cigarettes, and chewing tobacco. If you need help quitting, ask your health care provider. Do not use street drugs. Do not share needles. Ask your health care provider for help if you need support or information about quitting drugs. Alcohol use Do not drink alcohol if: Your health care provider tells you not to drink. You are pregnant, may be pregnant, or are planning to become pregnant. If you drink alcohol: Limit how much you use to 0-1 drink a day. Limit intake if you are breastfeeding. Be aware of how much alcohol is in your drink. In the [place] one drink equals one 12 oz bottle of beer (355 mL), one 5 oz glass of wine (148 mL), or one 1 oz glass of hard liquor (44 mL). General instructions Schedule regular health, dental, and eye exams. Stay current with your vaccines. Tell your health care provider if: You often feel depressed. You have ever been abused or do not feel safe at home. Summary Adopting a healthy lifestyle and getting preventive care are important in promoting health and wellness. Follow your health care provider's instructions about healthy diet, exercising, and getting tested or screened for diseases. Follow your health care provider's  instructions on monitoring your cholesterol and blood pressure. This information is not intended to replace advice given to you by your health care provider. Make sure you discuss any questions you have with your health care provider. Document Revised: 11/24/2020 Document Reviewed: 09/09/2018 Elsevier Patient Education  2022 [employer]vier Inc.  

## 2021-07-11 NOTE — Progress Notes (Signed)
Subjective:  Patient ID: Alexandra Robinson, female    DOB: Dec 13, 1955  Age: 65 y.o. MRN: [id number]  CC: Annual Exam, Hypertension, and Hyperlipidemia  This visit occurred during the SARS-CoV-2 public health emergency.  Safety protocols were in place, including screening questions prior to the visit, additional usage of staff PPE, and extensive cleaning of exam room while observing appropriate contact time as indicated for disinfecting solutions.    HPI NIOMA MCCUBBINS presents for a CPX and to establish.  She complains of a 6-week history of a sensation of pressure in her chest.  The sensation is there at rest but it is worsened by activity.  She denies shortness of breath, diaphoresis, dizziness, lightheadedness, or edema.  History Jaliyah has a past medical history of Primary hypertension (07/11/2021).   She has a past surgical history that includes Appendectomy and Breast cyst excision.   Her family history includes Alcoholism in her father; Breast cancer in her mother; Coronary artery disease in her brother; Diabetes in her father; Renal cancer in her brother.She reports that she has never smoked. She has never used smokeless tobacco. She reports that she does not currently use drugs. She reports that she does not drink alcohol.  Outpatient Medications Prior to Visit  Medication Sig Dispense Refill   albuterol (PROVENTIL HFA;VENTOLIN HFA) 108 (90 Base) MCG/ACT inhaler Inhale 2 puffs into the lungs every 4 (four) hours as needed for wheezing or shortness of breath. 1 Inhaler 1   anastrozole (ARIMIDEX) 1 MG tablet Take 1 tablet (1 mg total) by mouth daily. 90 tablet 3   benzonatate (TESSALON) 100 MG capsule Take 1 capsule (100 mg total) by mouth every 8 (eight) hours. 21 capsule 0   cetirizine (ZYRTEC) 5 MG chewable tablet Chew 1 tablet (5 mg total) by mouth daily. 20 tablet 0   fluticasone (FLONASE) 50 MCG/ACT nasal spray Place 2 sprays into both nostrils daily. 16 g 0   meclizine  (ANTIVERT) 12.5 MG tablet Take 1 tablet (12.5 mg total) by mouth 3 (three) times daily as needed for dizziness. 30 tablet 0   naproxen (NAPROSYN) 500 MG tablet Take 1 tablet (500 mg total) by mouth 2 (two) times daily. 30 tablet 0   oseltamivir (TAMIFLU) 75 MG capsule Take 1 capsule (75 mg total) by mouth every 12 (twelve) hours. 10 capsule 0   No facility-administered medications prior to visit.    ROS Review of Systems  Constitutional:  Negative for appetite change, chills, diaphoresis, fatigue and fever.  HENT: Negative.    Eyes: Negative.   Respiratory:  Negative for cough, chest tightness, shortness of breath and wheezing.   Cardiovascular:  Positive for chest pain. Negative for palpitations and leg swelling.  Gastrointestinal:  Negative for abdominal pain, constipation, diarrhea, nausea and vomiting.  Endocrine: Negative.   Genitourinary: Negative.  Negative for difficulty urinating, flank pain and hematuria.  Musculoskeletal: Negative.  Negative for arthralgias and myalgias.  Skin: Negative.   Neurological: Negative.  Negative for dizziness, weakness, light-headedness and numbness.       Intermittent vertigo for 20 years  Hematological:  Negative for adenopathy. Does not bruise/bleed easily.   Objective:  BP 136/84 (BP Location: Right Arm, Patient Position: Sitting, Cuff Size: Large)   Pulse 75   Temp 98.6 F (37 C) (Oral)   Ht 5' 7.75" (1.721 m)   Wt 286 lb (129.7 kg)   SpO2 98%   BMI 43.81 kg/m   Physical Exam Vitals reviewed.  Constitutional:  Appearance: She is obese.  HENT:     Nose: Nose normal.     Mouth/Throat:     Mouth: Mucous membranes are moist.  Eyes:     General: No scleral icterus.    Conjunctiva/sclera: Conjunctivae normal.  Cardiovascular:     Rate and Rhythm: Normal rate and regular rhythm.     Heart sounds: Normal heart sounds, S1 normal and S2 normal. No murmur heard.   No friction rub. No gallop.     Comments: EKG- SR with 1st degree  AV block Flat T waves laterally No LVH or Q waves Pulmonary:     Effort: Pulmonary effort is normal.     Breath sounds: No stridor. No wheezing, rhonchi or rales.  Abdominal:     General: Abdomen is protuberant. Bowel sounds are normal. There is no distension.     Palpations: Abdomen is soft. There is no hepatomegaly, splenomegaly or mass.     Tenderness: There is no abdominal tenderness. There is no guarding.     Hernia: No hernia is present.  Musculoskeletal:        General: Normal range of motion.     Cervical back: Neck supple.     Right lower leg: No edema.     Left lower leg: No edema.  Lymphadenopathy:     Cervical: No cervical adenopathy.  Skin:    General: Skin is warm and dry.  Neurological:     General: No focal deficit present.     Mental Status: She is alert and oriented to person, place, and time. Mental status is at baseline.  Psychiatric:        Mood and Affect: Mood normal.        Behavior: Behavior normal.        Thought Content: Thought content normal.        Judgment: Judgment normal.    Lab Results  Component Value Date   WBC 4.8 07/11/2021   HGB 13.7 07/11/2021   HCT 41.3 07/11/2021   PLT 252.0 07/11/2021   GLUCOSE 97 07/11/2021   CHOL 269 (H) 07/11/2021   TRIG 146.0 07/11/2021   HDL 62.50 07/11/2021   LDLCALC 177 (H) 07/11/2021   ALT 21 07/11/2021   AST 25 07/11/2021   NA 137 07/11/2021   K 3.9 07/11/2021   CL 104 07/11/2021   CREATININE 0.90 07/11/2021   BUN 13 07/11/2021   CO2 25 07/11/2021   TSH 3.33 07/11/2021   HGBA1C 6.0 07/11/2021     Assessment & Plan:   Alexandra Robinson was seen today for annual exam, hypertension and hyperlipidemia.  Diagnoses and all orders for this visit:  Pressure in left side of chest- She has subtle changes on her EKG.  Her labs are reassuring.  I have asked her to undergo an MPI to screen for ischemia. -     D-dimer, quantitative; Future -     Troponin I (High Sensitivity); Future -     EKG 12-Lead -      Cancel: Pro b natriuretic peptide (BNP); Future -     Pro b natriuretic peptide (BNP); Future -     Pro b natriuretic peptide (BNP) -     Troponin I (High Sensitivity) -     D-dimer, quantitative -     MYOCARDIAL PERFUSION IMAGING; Future  Primary hypertension- She has stage I hypertension.  Labs are negative for secondary causes or endorgan damage.  Will treat this with a calcium channel blocker. -  CBC with Differential/Platelet; Future -     Basic metabolic panel; Future -     TSH; Future -     Urinalysis, Routine w reflex microscopic; Future -     VITAMIN D 25 Hydroxy (Vit-D Deficiency, Fractures); Future -     EKG 12-Lead -     VITAMIN D 25 Hydroxy (Vit-D Deficiency, Fractures) -     Urinalysis, Routine w reflex microscopic -     TSH -     Basic metabolic panel -     CBC with Differential/Platelet -     amLODipine (NORVASC) 5 MG tablet; Take 1 tablet (5 mg total) by mouth daily.  Hyperlipidemia with target LDL less than 100- I recommended that she take a statin for cardiovascular risk reduction. -     Lipid panel; Future -     TSH; Future -     Hepatic function panel; Future -     Hepatic function panel -     TSH -     Lipid panel -     rosuvastatin (CRESTOR) 20 MG tablet; Take 1 tablet (20 mg total) by mouth daily.  Encounter for general adult medical examination with abnormal findings- Exam completed, labs reviewed, vaccines reviewed and updated, cancer screenings addressed, patient education was given.  Colon cancer screening -     Cologuard  Visit for screening mammogram -     MM DIGITAL SCREENING BILATERAL; Future  Need for prophylactic vaccination with combined diphtheria-tetanus-pertussis (DTP) vaccine -     Tdap (BOOSTRIX) 5-2.5-18.5 LF-MCG/0.5 injection; Inject 0.5 mLs into the muscle once for 1 dose.  Need for shingles vaccine -     Zoster Vaccine Adjuvanted [facility]) injection; Inject 0.5 mLs into the muscle once for 1 dose.  Screening for HIV (human  immunodeficiency virus) -     HIV Antibody (routine testing w rflx); Future -     HIV Antibody (routine testing w rflx)  Need for hepatitis C screening test -     Hepatitis C antibody; Future -     Hepatitis C antibody  Class 3 severe obesity due to excess calories with serious comorbidity and body mass index (BMI) of 40.0 to 44.9 in adult [facility])- She is prediabetic but labs are otherwise negative for secondary causes or complications. -     TSH; Future -     Hemoglobin A1c; Future -     VITAMIN D 25 Hydroxy (Vit-D Deficiency, Fractures); Future -     VITAMIN D 25 Hydroxy (Vit-D Deficiency, Fractures) -     Hemoglobin A1c -     TSH  Abnormal electrocardiogram (ECG) (EKG) -     MYOCARDIAL PERFUSION IMAGING; Future  Prediabetes- Her A1c is at 6.0%.  Medical therapy is not yet indicated.  Abnormal electrocardiogram -     MYOCARDIAL PERFUSION IMAGING; Future  I have discontinued Martie O. Hashimi's albuterol, naproxen, anastrozole, meclizine, oseltamivir, fluticasone, benzonatate, and cetirizine. I am also having her start on Boostrix, Shingrix, rosuvastatin, and amLODipine.  Meds ordered this encounter  Medications   Tdap (BOOSTRIX) 5-2.5-18.5 LF-MCG/0.5 injection    Sig: Inject 0.5 mLs into the muscle once for 1 dose.    Dispense:  0.5 mL    Refill:  0   Zoster Vaccine Adjuvanted [facility]) injection    Sig: Inject 0.5 mLs into the muscle once for 1 dose.    Dispense:  0.5 mL    Refill:  1   rosuvastatin (CRESTOR) 20 MG tablet  Sig: Take 1 tablet (20 mg total) by mouth daily.    Dispense:  90 tablet    Refill:  1   amLODipine (NORVASC) 5 MG tablet    Sig: Take 1 tablet (5 mg total) by mouth daily.    Dispense:  90 tablet    Refill:  0      Follow-up: Return in about 3 months (around 10/11/2021).  Scarlette Calico, MD

## 2021-07-12 ENCOUNTER — Encounter: Payer: Self-pay | Admitting: Internal Medicine

## 2021-07-12 LAB — D-DIMER, QUANTITATIVE: D-Dimer, Quant: 0.38 mcg/mL FEU (ref <0.50)

## 2021-07-12 LAB — HEPATITIS C ANTIBODY
Hepatitis C Ab: NONREACTIVE
SIGNAL TO CUT-OFF: 0.07 (ref <1.00)

## 2021-07-12 LAB — PRO B NATRIURETIC PEPTIDE: NT-Pro BNP: 35 pg/mL (ref 0–301)

## 2021-07-12 LAB — HIV ANTIBODY (ROUTINE TESTING W REFLEX): HIV 1&2 Ab, 4th Generation: NONREACTIVE

## 2021-07-14 ENCOUNTER — Encounter: Payer: Self-pay | Admitting: Internal Medicine

## 2021-07-26 ENCOUNTER — Other Ambulatory Visit: Payer: Self-pay | Admitting: Internal Medicine

## 2021-08-08 ENCOUNTER — Ambulatory Visit (HOSPITAL_COMMUNITY)
Admission: EM | Admit: 2021-08-08 | Discharge: 2021-08-08 | Disposition: A | Discharge status: Home / Self Care | Payer: PPO | Attending: Family Medicine | Admitting: Family Medicine

## 2021-08-08 ENCOUNTER — Other Ambulatory Visit: Payer: Self-pay

## 2021-08-08 ENCOUNTER — Emergency Department (HOSPITAL_BASED_OUTPATIENT_CLINIC_OR_DEPARTMENT_OTHER)
Admission: EM | Admit: 2021-08-08 | Discharge: 2021-08-09 | Disposition: A | Discharge status: Home / Self Care | Payer: PPO | Attending: Emergency Medicine | Admitting: Emergency Medicine

## 2021-08-08 ENCOUNTER — Encounter (HOSPITAL_COMMUNITY): Payer: Self-pay

## 2021-08-08 ENCOUNTER — Encounter (HOSPITAL_BASED_OUTPATIENT_CLINIC_OR_DEPARTMENT_OTHER): Payer: Self-pay | Admitting: Obstetrics and Gynecology

## 2021-08-08 ENCOUNTER — Emergency Department (HOSPITAL_BASED_OUTPATIENT_CLINIC_OR_DEPARTMENT_OTHER): Payer: PPO | Admitting: Radiology

## 2021-08-08 DIAGNOSIS — R079 Chest pain, unspecified: Secondary | ICD-10-CM

## 2021-08-08 DIAGNOSIS — Z86000 Personal history of in-situ neoplasm of breast: Secondary | ICD-10-CM | POA: Diagnosis not present

## 2021-08-08 DIAGNOSIS — I10 Essential (primary) hypertension: Secondary | ICD-10-CM | POA: Diagnosis not present

## 2021-08-08 DIAGNOSIS — Z79899 Other long term (current) drug therapy: Secondary | ICD-10-CM | POA: Diagnosis not present

## 2021-08-08 DIAGNOSIS — R0789 Other chest pain: Secondary | ICD-10-CM

## 2021-08-08 LAB — COMPREHENSIVE METABOLIC PANEL
ALT: 19 U/L (ref 0–44)
AST: 19 U/L (ref 15–41)
Albumin: 4.2 g/dL (ref 3.5–5.0)
Alkaline Phosphatase: 40 U/L (ref 38–126)
Anion gap: 7 (ref 5–15)
BUN: 14 mg/dL (ref 8–23)
CO2: 29 mmol/L (ref 22–32)
Calcium: 10 mg/dL (ref 8.9–10.3)
Chloride: 102 mmol/L (ref 98–111)
Creatinine, Ser: 0.9 mg/dL (ref 0.44–1.00)
GFR, Estimated: >60 mL/min (ref >60)
Glucose, Bld: 94 mg/dL (ref 70–99)
Potassium: 4 mmol/L (ref 3.5–5.1)
Sodium: 138 mmol/L (ref 135–145)
Total Bilirubin: 0.4 mg/dL (ref 0.3–1.2)
Total Protein: 7.8 g/dL (ref 6.5–8.1)

## 2021-08-08 LAB — CBC WITH DIFFERENTIAL/PLATELET
Abs Immature Granulocytes: 0.01 10*3/uL (ref 0.00–0.07)
Basophils Absolute: 0 10*3/uL (ref 0.0–0.1)
Basophils Relative: 1 %
Eosinophils Absolute: 0.1 10*3/uL (ref 0.0–0.5)
Eosinophils Relative: 2 %
HCT: 43 % (ref 36.0–46.0)
Hemoglobin: 13.9 g/dL (ref 12.0–15.0)
Immature Granulocytes: 0 %
Lymphocytes Relative: 42 %
Lymphs Abs: 2.8 10*3/uL (ref 0.7–4.0)
MCH: 30.8 pg (ref 26.0–34.0)
MCHC: 32.3 g/dL (ref 30.0–36.0)
MCV: 95.1 fL (ref 80.0–100.0)
Monocytes Absolute: 0.6 10*3/uL (ref 0.1–1.0)
Monocytes Relative: 9 %
Neutro Abs: 3.1 10*3/uL (ref 1.7–7.7)
Neutrophils Relative %: 46 %
Platelets: 282 10*3/uL (ref 150–400)
RBC: 4.52 MIL/uL (ref 3.87–5.11)
RDW: 13 % (ref 11.5–15.5)
WBC: 6.7 10*3/uL (ref 4.0–10.5)
nRBC: 0 % (ref 0.0–0.2)

## 2021-08-08 LAB — TROPONIN I (HIGH SENSITIVITY): Troponin I (High Sensitivity): 3 ng/L (ref <18)

## 2021-08-08 LAB — D-DIMER, QUANTITATIVE: D-Dimer, Quant: 0.33 ug/mL-FEU (ref 0.00–0.50)

## 2021-08-08 NOTE — ED Triage Notes (Signed)
Pt presents with complaints of pain under her right breast that goes up in to her chest and shoots into her back x 2 days.

## 2021-08-08 NOTE — Discharge Instructions (Addendum)
Strongly recommend further work-up at one of our Emergency Departments to rule out a blood clot as a source of your pain. Your EKG remains unchanged from your recent visit with your PCP. Given the intensity of your chest wall pain involving the right side of your chest I am concerned for possible blood clot in the lung which has to be diagnosed via CT scan.  I am providing you with a list of our emergency departments that have the capability to rule out an active blood clot.

## 2021-08-08 NOTE — ED Triage Notes (Signed)
Patient reports to the ER for right breast/chest area pain that radiates to her back. Reports it started Sunday and has gotten worse. Patient was just seen at urgent care and had an EKG done and was sent here for bloodwork and CT scan.

## 2021-08-08 NOTE — ED Provider Notes (Addendum)
[facility]    CSN: [id number] Arrival date & time: 08/08/21  1720      History   Chief Complaint Chief Complaint  Patient presents with   Chest Pain    HPI Alexandra Robinson is a 65 y.o. female.   HPI Patient presents today with right sided chest wall pain that has been present for 3 days.  The pain is occurring in intervals of approximately every 20 to 30 minutes . Today the pain appeared to worsen in intensity the pain is located behind her right breast and radiates up to her upper right chest wall.  She reports that at the severest the pain is a stabbing throbbing piercing type pain that radiates to her back.  She characterizes the pain similar to that of labor pains.  The pain does not radiate and across her chest and does not involve her left arm.  Patient has not recently traveled or had any prolonged sedentary state. According to EMR patient has a history of ductal carcinoma although patient denies any treatment or any formal diagnosis of breast cancer.  She denies any URI symptoms or any recent cough.  She has taken Tylenol today without any relief of her symptoms.  Patient was seen by PCP a few weeks ago and had left-sided chest pain and had a negative cardiac work-up including cardiac enzymes. Past Medical History:  Diagnosis Date   Primary hypertension 07/11/2021    Patient Active Problem List   Diagnosis Date Noted   Pressure in left side of chest 07/11/2021   Primary hypertension 07/11/2021   Hyperlipidemia with target LDL less than 100 07/11/2021   Encounter for general adult medical examination with abnormal findings 07/11/2021   Colon cancer screening 07/11/2021   Visit for screening mammogram 07/11/2021   Need for prophylactic vaccination with combined diphtheria-tetanus-pertussis (DTP) vaccine 07/11/2021   Screening for HIV (human immunodeficiency virus) 07/11/2021   Need for hepatitis C screening test 07/11/2021   Abnormal electrocardiogram (ECG)  (EKG) 07/11/2021   Class 3 severe obesity due to excess calories with serious comorbidity and body mass index (BMI) of 40.0 to 44.9 in adult [facility]) 07/11/2021   Need for shingles vaccine 07/11/2021   Prediabetes 07/11/2021   Ductal carcinoma in situ (DCIS) of left breast 06/29/2018    Past Surgical History:  Procedure Laterality Date   APPENDECTOMY     BREAST CYST EXCISION      OB History   No obstetric history on file.      Home Medications    Prior to Admission medications   Medication Sig Start Date End Date Taking? Authorizing Provider  amLODipine (NORVASC) 5 MG tablet Take 1 tablet (5 mg total) by mouth daily. 07/11/21   Janith Lima, MD  rosuvastatin (CRESTOR) 20 MG tablet Take 1 tablet (20 mg total) by mouth daily. 07/11/21   Janith Lima, MD    Family History Family History  Problem Relation Age of Onset   Breast cancer Mother        late 35s    Diabetes Father    Alcoholism Father    Coronary artery disease Brother    Renal cancer Brother     Social History Social History   Tobacco Use   Smoking status: Never   Smokeless tobacco: Never  Substance Use Topics   Alcohol use: No   Drug use: Not Currently     Allergies   Bactrim [sulfamethoxazole-trimethoprim]   Review of Systems Review of Systems  Pertinent negatives listed in HPI  Physical Exam Triage Vital Signs ED Triage Vitals  Enc Vitals Group     BP 08/08/21 1822 137/86     Pulse Rate 08/08/21 1822 67     Resp 08/08/21 1822 19     Temp 08/08/21 1822 98.4 F (36.9 C)     Temp src --      SpO2 08/08/21 1822 96 %     Weight --      Height --      Head Circumference --      Peak Flow --      Pain Score 08/08/21 1823 0     Pain Loc --      Pain Edu? --      Excl. in [place]? --    No data found.  Updated Vital Signs BP 137/86   Pulse 67   Temp 98.4 F (36.9 C)   Resp 19   SpO2 96%   Visual Acuity Right Eye Distance:   Left Eye Distance:   Bilateral Distance:    Right  Eye Near:   Left Eye Near:    Bilateral Near:     Physical Exam Constitutional:      Appearance: She is obese. She is not ill-appearing or toxic-appearing.  HENT:     Head: Normocephalic.  Eyes:     Extraocular Movements: Extraocular movements intact.     Pupils: Pupils are equal, round, and reactive to light.  Cardiovascular:     Rate and Rhythm: Normal rate and regular rhythm.     Heart sounds: Normal heart sounds. Heart sounds not distant.  No systolic murmur is present.  Pulmonary:     Effort: Pulmonary effort is normal.  Chest:    Musculoskeletal:     Right lower leg: No tenderness. No edema.     Left lower leg: No tenderness. No edema.  Skin:    General: Skin is warm and dry.     Capillary Refill: Capillary refill takes less than 2 seconds.  Neurological:     General: No focal deficit present.     Mental Status: She is alert.  Psychiatric:        Mood and Affect: Mood normal.        Behavior: Behavior normal.     UC Treatments / Results  Labs (all labs ordered are listed, but only abnormal results are displayed) Labs Reviewed - No data to display  EKG NSR with rate 69, no acute sT changes  Radiology No results found.  Procedures Procedures (including critical care time)  Medications Ordered in UC Medications - No data to display  Initial Impression / Assessment and Plan / UC Course  I have reviewed the triage vital signs and the nursing notes.  Pertinent labs & imaging results that were available during my care of the patient were reviewed by me and considered in my medical decision making (see chart for details).    Patient presents today with atypical right-sided chest pain.  EKG collected in is unchanged from EKG taken in PCP office a few weeks back. The pain patient is presenting with today's nonreproducible and is sharp and piercing and only involves the right side.  High suspicion for possible PE patient has risk factors of obesity, prior history  of left breast cancer documented within her EMR for which she denies diagnosis and reports no further treatment, intermittent sedentary lifestyle.  Patient had no recent history of any leg pain no recent history  of upper respiratory symptoms.  Given lack of improvement with oral over-the-counter analgesics and given patient's age she warrants further work-up and evaluation in the setting of the ER to rule out any life-threatening source of 3 days of right-sided chest pain.  Information given to follow-up at 3 of our local ER establishments.  Patient verbalized understanding and agreement with today's plan. Final Clinical Impressions(s) / UC Diagnoses   Final diagnoses:  Atypical chest pain Right-Sided     Discharge Instructions      Strongly recommend further work-up at one of our Emergency Departments to rule out a blood clot as a source of your pain. Your EKG remains unchanged from your recent visit with your PCP. Given the intensity of your chest wall pain involving the right side of your chest I am concerned for possible blood clot in the lung which has to be diagnosed via CT scan.  I am providing you with a list of our emergency departments that have the capability to rule out an active blood clot.      ED Prescriptions   None    PDMP not reviewed this encounter.   Scot Jun, FNP 08/08/21 1911    Scot Jun, FNP 08/08/21 1912

## 2021-08-09 LAB — TROPONIN I (HIGH SENSITIVITY): Troponin I (High Sensitivity): 3 ng/L (ref <18)

## 2021-08-09 MED ORDER — KETOROLAC TROMETHAMINE 30 MG/ML IJ SOLN
15.0000 mg | Freq: Once | INTRAMUSCULAR | Status: AC
  Administered 2021-08-09: 15 mg via INTRAVENOUS
  Filled 2021-08-09: qty 1

## 2021-08-09 MED ORDER — ASPIRIN 81 MG PO CHEW
324.0000 mg | CHEWABLE_TABLET | Freq: Once | ORAL | Status: AC
  Administered 2021-08-09: 324 mg via ORAL
  Filled 2021-08-09: qty 4

## 2021-08-09 NOTE — Discharge Instructions (Signed)
Please follow-up with the cardiologist for further outpatient testing on your heart.  Until you see the cardiologist, please take aspirin 81 mg once a day.  Once your evaluation is completed, the cardiologist will tell you whether you should continue taking aspirin after that.  Take 1 naproxen (Aleve) tablet at a time, twice a day for the next 10 days.  Return if you are having any new or concerning symptoms.

## 2021-08-09 NOTE — ED Provider Notes (Signed)
[facility] EMERGENCY DEPT Provider Note   CSN: [id number] Arrival date & time: 08/08/21  1924     History Chief Complaint  Patient presents with   Chest Pain    Alexandra Robinson is a 65 y.o. female.  The history is provided by the patient.  Chest Pain She has history of hypertension, hyperlipidemia and was sent here from urgent care because of chest pain.  Chest pain started 3 days ago at which time it was right-sided and constant.  Yesterday, pain became sharp and episodic.  Pain would last for about 3 minutes at a time and would be as severe as 7/10.  There is no associated dyspnea, nausea, diaphoresis.  Patient states that it seems to come on without any particular pattern although may be it comes if she twists a certain way.  It is not clearly exertional.  Curiously, patient states that she does not have a history of hypertension or hyperlipidemia and is not currently taking any medications.  She is a non-smoker and denies family history of premature coronary atherosclerosis.   Past Medical History:  Diagnosis Date   Primary hypertension 07/11/2021    Patient Active Problem List   Diagnosis Date Noted   Pressure in left side of chest 07/11/2021   Primary hypertension 07/11/2021   Hyperlipidemia with target LDL less than 100 07/11/2021   Encounter for general adult medical examination with abnormal findings 07/11/2021   Colon cancer screening 07/11/2021   Visit for screening mammogram 07/11/2021   Need for prophylactic vaccination with combined diphtheria-tetanus-pertussis (DTP) vaccine 07/11/2021   Screening for HIV (human immunodeficiency virus) 07/11/2021   Need for hepatitis C screening test 07/11/2021   Abnormal electrocardiogram (ECG) (EKG) 07/11/2021   Class 3 severe obesity due to excess calories with serious comorbidity and body mass index (BMI) of 40.0 to 44.9 in adult [facility]) 07/11/2021   Need for shingles vaccine 07/11/2021   Prediabetes 07/11/2021    Ductal carcinoma in situ (DCIS) of left breast 06/29/2018    Past Surgical History:  Procedure Laterality Date   APPENDECTOMY     BREAST CYST EXCISION       OB History   No obstetric history on file.     Family History  Problem Relation Age of Onset   Breast cancer Mother        late 24s    Diabetes Father    Alcoholism Father    Coronary artery disease Brother    Renal cancer Brother     Social History   Tobacco Use   Smoking status: Never   Smokeless tobacco: Never  Substance Use Topics   Alcohol use: No   Drug use: Not Currently    Home Medications Prior to Admission medications   Medication Sig Start Date End Date Taking? Authorizing Provider  amLODipine (NORVASC) 5 MG tablet Take 1 tablet (5 mg total) by mouth daily. 07/11/21   Janith Lima, MD  rosuvastatin (CRESTOR) 20 MG tablet Take 1 tablet (20 mg total) by mouth daily. 07/11/21   Janith Lima, MD    Allergies    Bactrim [sulfamethoxazole-trimethoprim]  Review of Systems   Review of Systems  Cardiovascular:  Positive for chest pain.  All other systems reviewed and are negative.  Physical Exam Updated Vital Signs BP 128/72 (BP Location: Right Arm)   Pulse 69   Temp 98.1 F (36.7 C) (Oral)   Resp 17   SpO2 99%   Physical Exam Vitals and nursing note  reviewed.  65 year old female, resting comfortably and in no acute distress. Vital signs are normal. Oxygen saturation is 99%, which is normal. Head is normocephalic and atraumatic. PERRLA, EOMI. Oropharynx is clear. Neck is nontender and supple without adenopathy or JVD. Back is nontender and there is no CVA tenderness. Lungs are clear without rales, wheezes, or rhonchi. Chest is nontender. Heart has regular rate and rhythm without murmur. Abdomen is soft, flat, nontender without masses or hepatosplenomegaly and peristalsis is normoactive. Extremities have no cyanosis or edema, full range of motion is present. Skin is warm and dry without  rash. Neurologic: Mental status is normal, cranial nerves are intact, there are no motor or sensory deficits.  ED Results / Procedures / Treatments   Labs (all labs ordered are listed, but only abnormal results are displayed) Labs Reviewed  D-DIMER, QUANTITATIVE  CBC WITH DIFFERENTIAL/PLATELET  COMPREHENSIVE METABOLIC PANEL  TROPONIN I (HIGH SENSITIVITY)  TROPONIN I (HIGH SENSITIVITY)    EKG ED ECG REPORT   Date: 08/09/2021  Rate: 69  Rhythm: normal sinus rhythm  QRS Axis: normal  Intervals: PR prolonged  ST/T Wave abnormalities: normal  Conduction Disutrbances:first-degree A-V block   Narrative Interpretation: Sinus rhythm with first-degree AV block, otherwise normal ECG.  No prior ECG available for comparison.  Old EKG Reviewed: none available  I have personally reviewed the EKG tracing and agree with the computerized printout as noted.  Radiology DG Chest 2 View  Result Date: 08/08/2021 CLINICAL DATA:  Chest pain EXAM: CHEST - 2 VIEW COMPARISON:  Chest x-ray 08/03/2018 FINDINGS: The heart and mediastinal contours are unchanged. No focal consolidation. No pulmonary edema. No pleural effusion. No pneumothorax. No acute osseous abnormality. IMPRESSION: No active cardiopulmonary disease. Electronically Signed   By: Iven Finn M.D.   On: 08/08/2021 20:15    Procedures Procedures   Medications Ordered in ED Medications  aspirin chewable tablet 324 mg (324 mg Oral Given 08/09/21 0035)  ketorolac (TORADOL) 30 MG/ML injection 15 mg (15 mg Intravenous Given 08/09/21 0034)    ED Course  I have reviewed the triage vital signs and the nursing notes.  Pertinent labs & imaging results that were available during my care of the patient were reviewed by me and considered in my medical decision making (see chart for details).   MDM Rules/Calculators/A&P                         Chest pain which is somewhat atypical.  There is a disconnect between patient's stated history and  what is in the medical record.  She is apparently supposed to be on amlodipine for blood pressure and rosuvastatin for hyperlipidemia.  On review of old records, lipid profile on 07/11/2021 did show elevated cholesterol 269 and elevated LDL of 177.  She had complained of some chest discomfort at that office visit and office note does state that myocardial perfusion imaging would be ordered, but it is not in the system.  ECG shows no ST or T changes, chest x-ray shows no active cardiopulmonary disease.  Initial troponin is normal and D-dimer is normal.  Based on the history patient has given me, her risk score in the heart pathway is 3, which puts her at low risk for major adverse cardiac events.  She will be given aspirin and a dose of ketorolac, currently awaiting repeat troponin.  Anticipate need for cardiology referral for consideration for outpatient stress testing.  Repeat troponin is unchanged.  She  has not had any more episodes of pain since getting ketorolac.  She is discharged with instructions to take low-dose aspirin daily until she is seen by cardiology, also advised to take naproxen twice a day for the next 10 days.  Return precautions discussed.  Final Clinical Impression(s) / ED Diagnoses Final diagnoses:  Nonspecific chest pain    Rx / DC Orders ED Discharge Orders     None        Delora Fuel, MD 14/38/88 0128

## 2021-08-09 NOTE — ED Notes (Signed)
Patient discharged home to self.  VS WDL.  All discharge instructions reviewed.  Patient verbalizes understanding via teachback method.  Ambulatory out of ED.

## 2021-08-13 ENCOUNTER — Encounter: Payer: Self-pay | Admitting: Cardiovascular Disease

## 2021-08-13 ENCOUNTER — Ambulatory Visit (INDEPENDENT_AMBULATORY_CARE_PROVIDER_SITE_OTHER): Payer: PPO | Admitting: Cardiovascular Disease

## 2021-08-13 ENCOUNTER — Other Ambulatory Visit: Payer: Self-pay

## 2021-08-13 VITALS — BP 150/78 | HR 78 | Ht 68.0 in | Wt 287.0 lb

## 2021-08-13 DIAGNOSIS — E7800 Pure hypercholesterolemia, unspecified: Secondary | ICD-10-CM | POA: Diagnosis not present

## 2021-08-13 DIAGNOSIS — I10 Essential (primary) hypertension: Secondary | ICD-10-CM | POA: Diagnosis not present

## 2021-08-13 DIAGNOSIS — R072 Precordial pain: Secondary | ICD-10-CM | POA: Diagnosis not present

## 2021-08-13 DIAGNOSIS — R0683 Snoring: Secondary | ICD-10-CM

## 2021-08-13 DIAGNOSIS — E785 Hyperlipidemia, unspecified: Secondary | ICD-10-CM

## 2021-08-13 DIAGNOSIS — R1013 Epigastric pain: Secondary | ICD-10-CM | POA: Diagnosis not present

## 2021-08-13 MED ORDER — AMLODIPINE BESYLATE 5 MG PO TABS: 5.0000 mg | ORAL_TABLET | Freq: Every day | ORAL | 3 refills | Status: DC

## 2021-08-13 MED ORDER — METOPROLOL TARTRATE 100 MG PO TABS: ORAL_TABLET | ORAL | 0 refills | Status: DC

## 2021-08-13 MED ORDER — ASPIRIN EC 81 MG PO TBEC
81.0000 mg | DELAYED_RELEASE_TABLET | Freq: Every day | ORAL | 3 refills | Status: DC
Start: 2021-08-13 — End: 2023-09-23

## 2021-08-13 MED ORDER — ROSUVASTATIN CALCIUM 20 MG PO TABS: 20.0000 mg | ORAL_TABLET | Freq: Every day | ORAL | 3 refills | Status: DC

## 2021-08-13 MED ORDER — PANTOPRAZOLE SODIUM 40 MG PO TBEC
40.0000 mg | DELAYED_RELEASE_TABLET | Freq: Every day | ORAL | 3 refills | Status: DC
Start: 2021-08-13 — End: 2022-12-05

## 2021-08-13 NOTE — Progress Notes (Signed)
Cardiology Office Note    Date:  08/20/2021   ID:  ROSELYNNE LORTZ, DOB 02/21/1956, MRN [id number]  PCP:  Janith Lima, MD  Cardiologist:  Shelva Majestic, MD   New cardiology evaluation referred through MED center [facility] ER, Dr. Delora Fuel for evaluation of chest pain.   History of Present Illness:  Alexandra Robinson is a 65 y.o. female who has a history of hypertension and hyperlipidemia.  She initially presented to [facility] on August 08, 2021 with complaints of 2 to 3 days of intermittent chest pain which were occurring every 20 to 30 minutes.  The pain was worse in intensity behind her right breast and radiated to her upper right chest wall.  At times the pain was described as stabbing but at other times was radiating to her back.  She was on amlodipine 5 mg and rosuvastatin 20 mg.  Initial vital signs were stable.  However due to concerns for her several days of chest pain she was referred to the [facility] emergency department at [facility].  She was evaluated by Dr. Delora Fuel at med center [facility].  Vital signs were stable.  Troponins were negative.  It was felt that her chest pain was somewhat atypical.  D-dimer was negative.  Chest x-ray did not reveal any active cardiovascular disease.  The patient was discharged with instructions to take low-dose aspirin, advised to take Naprosyn twice a day for the next 10 days and cardiology evaluation was recommended.  Presently, her chest pain has improved.  In retrospect, last week she was having increased indigestion and was eating significant amount of fried foods.  She had been taking Tums and Pepcid.  Time she noted some mild vertigo.  She apparently had never started the rosuvastatin and she did not derive significant benefit from Aleve.  Upon further questioning she sleeps on her right side and snores.  She presents for evaluation.  Past Medical History:  Diagnosis Date   Primary hypertension  07/11/2021    Past Surgical History:  Procedure Laterality Date   APPENDECTOMY     BREAST CYST EXCISION      Current Medications: Outpatient Medications Prior to Visit  Medication Sig Dispense Refill   amLODipine (NORVASC) 5 MG tablet Take 1 tablet (5 mg total) by mouth daily. (Patient not taking: Reported on 08/13/2021) 90 tablet 0   rosuvastatin (CRESTOR) 20 MG tablet Take 1 tablet (20 mg total) by mouth daily. (Patient not taking: Reported on 08/13/2021) 90 tablet 1   No facility-administered medications prior to visit.     Allergies:   Bactrim [sulfamethoxazole-trimethoprim]   Social History   Socioeconomic History   Marital status: Widowed    Spouse name: Not on file   Number of children: Not on file   Years of education: Not on file   Highest education level: Not on file  Occupational History   Not on file  Tobacco Use   Smoking status: Never   Smokeless tobacco: Never  Substance and Sexual Activity   Alcohol use: No   Drug use: Not Currently   Sexual activity: Not Currently    Partners: Male  Other Topics Concern   Not on file  Social History Narrative   Not on file   Social Determinants of Health   Financial Resource Strain: Not on file  Food Insecurity: Not on file  Transportation Needs: Not on file  Physical Activity: Not on file  Stress: Not on file  Social Connections: Not on file    She is a Firefighter by profession.  She is widowed for 12 years.  She has 5 children, 10 grandchildren and 2 great-grandchildren.  Her daughter lives with her.  There is no tobacco use or alcohol use.  She does not exercise.  Family History:  The patient's family history includes Alcoholism in her father; Breast cancer in her mother; Coronary artery disease in her brother; Diabetes in her father; Renal cancer in her brother.  Her mother died at age 70 with cancer.  Father died at age 17 with kidney failure.  2 brothers are deceased 1 with cancer and 1 following a  myocardial infarction.  She has 2 living sisters ages 70 and 31.  ROS General: Negative; No fevers, chills, or night sweats;  HEENT: Negative; No changes in vision or hearing, sinus congestion, difficulty swallowing Pulmonary: Negative; No cough, wheezing, shortness of breath, hemoptysis Cardiovascular: See HPI GI: Abdominal gas GU: Negative; No dysuria, hematuria, or difficulty voiding Musculoskeletal: Negative; no myalgias, joint pain, or weakness Hematologic/Oncology: Negative; no easy bruising, bleeding Endocrine: Negative; no heat/cold intolerance; no diabetes Neuro: Negative; no changes in balance, headaches Skin: Negative; No rashes or skin lesions Psychiatric: Negative; No behavioral problems, depression Sleep: Negative; No snoring, daytime sleepiness, hypersomnolence, bruxism, restless legs, hypnogognic hallucinations, no cataplexy Other comprehensive 14 point system review is negative.   PHYSICAL EXAM:   VS:  BP (!) 150/78   Pulse 78   Ht _0  (1.727 m)   Wt 287 lb (130.2 kg)   SpO2 95%   BMI 43.64 kg/m     Repeat blood pressure by me was 132/78  Wt Readings from Last 3 Encounters:  08/13/21 287 lb (130.2 kg)  07/11/21 286 lb (129.7 kg)  06/29/18 283 lb 8 oz (128.6 kg)    General: Alert, oriented, no distress.  Morbid obesity Skin: normal turgor, no rashes, warm and dry HEENT: Normocephalic, atraumatic. Pupils equal round and reactive to light; sclera anicteric; extraocular muscles intact;  Nose without nasal septal hypertrophy Mouth/Parynx benign; Mallinpatti scale 3/4 Neck: No JVD, no carotid bruits; normal carotid upstroke Lungs: clear to ausculatation and percussion; no wheezing or rales Chest wall: without tenderness to palpitation Heart: PMI not displaced, RRR, s1 s2 normal, 1/6 systolic murmur, no diastolic murmur, no rubs, gallops, thrills, or heaves Abdomen: soft, nontender; no hepatosplenomehaly, BS+; abdominal aorta nontender and not dilated by  palpation. Back: no CVA tenderness Pulses 2+ Musculoskeletal: full range of motion, normal strength, no joint deformities Extremities: no clubbing cyanosis or edema, Homan's sign negative  Neurologic: grossly nonfocal; Cranial nerves grossly wnl Psychologic: Normal mood and affect   Studies/Labs Reviewed:   EKG:  EKG is ordered today.  ECG (independently read by me):  NSR at 78; no ectopy, normalintervals  Recent Labs: BMP Latest Ref Rng & Units 08/08/2021 07/11/2021  Glucose 70 - 99 mg/dL 94 97  BUN 8 - 23 mg/dL 14 13  Creatinine 0.44 - 1.00 mg/dL 0.90 0.90  Sodium 135 - 145 mmol/L 138 137  Potassium 3.5 - 5.1 mmol/L 4.0 3.9  Chloride 98 - 111 mmol/L 102 104  CO2 22 - 32 mmol/L 29 25  Calcium 8.9 - 10.3 mg/dL 10.0 9.7     Hepatic Function Latest Ref Rng & Units 08/08/2021 07/11/2021  Total Protein 6.5 - 8.1 g/dL 7.8 7.6  Albumin 3.5 - 5.0 g/dL 4.2 4.2  AST 15 - 41 U/L 19 25  ALT 0 - 44 U/L  19 21  Alk Phosphatase 38 - 126 U/L 40 42  Total Bilirubin 0.3 - 1.2 mg/dL 0.4 0.4  Bilirubin, Direct 0.0 - 0.3 mg/dL - 0.1    CBC Latest Ref Rng & Units 08/08/2021 07/11/2021  WBC 4.0 - 10.5 K/uL 6.7 4.8  Hemoglobin 12.0 - 15.0 g/dL 13.9 13.7  Hematocrit 36.0 - 46.0 % 43.0 41.3  Platelets 150 - 400 K/uL 282 252.0   Lab Results  Component Value Date   MCV 95.1 08/08/2021   MCV 93.4 07/11/2021   Lab Results  Component Value Date   TSH 3.33 07/11/2021   Lab Results  Component Value Date   HGBA1C 6.0 07/11/2021     BNP No results found for: BNP  ProBNP    Component Value Date/Time   PROBNP 35 07/11/2021 0902     Lipid Panel     Component Value Date/Time   CHOL 269 (H) 07/11/2021 0902   TRIG 146.0 07/11/2021 0902   HDL 62.50 07/11/2021 0902   CHOLHDL 4 07/11/2021 0902   VLDL 29.2 07/11/2021 0902   LDLCALC 177 (H) 07/11/2021 0902     RADIOLOGY: DG Chest 2 View  Result Date: 08/08/2021 CLINICAL DATA:  Chest pain EXAM: CHEST - 2 VIEW COMPARISON:  Chest x-ray  08/03/2018 FINDINGS: The heart and mediastinal contours are unchanged. No focal consolidation. No pulmonary edema. No pleural effusion. No pneumothorax. No acute osseous abnormality. IMPRESSION: No active cardiopulmonary disease. Electronically Signed   By: Iven Finn M.D.   On: 08/08/2021 20:15     Additional studies/ records that were reviewed today include:   I reviewed the patient's records from both Urgent care and [facility]   ASSESSMENT:    1. Precordial pain   2. Pure hypercholesterolemia   3. Primary hypertension   4. Morbid obesity ([place])   5. Dyspepsia   6. Snoring     PLAN:  Ms. 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  She admits to a poor diet and often eats fried foods at most meals.  Recently she had noticed more indigestion and "gas".  She had presented to urgent care in the ER on August 08, 2021 with 3-day history of recurrent episodic chest discomfort which at times was a stabbing sensation and at other times was very sharp involving the right side of her chest.  Her chest pain is atypical for ischemic heart disease.  Her troponins were negative and D-dimer was normal.  However she does have several risk factors including hypertension for which she has been on amlodipine 5 mg daily.  She also has a history of significant hyperlipidemia with laboratory in October 2022 showing a total cholesterol at 269 and LDL cholesterol at 177.  She was told to initiate rosuvastatin which she had not yet done.  She does have dyspepsia.  She did not derive significant benefit with Aleve.  Presently, I have recommended she initiate rosuvastatin 20 mg daily and add baby aspirin 81 mg.  I am also adding Protonix 40 mg daily to see if this improves some of her dyspeptic symptoms.  With her significant lipid elevation and risk factors, I am also recommending she undergo a coronary CTA for further evaluation of CAD.  If calcification or CAD is present target LDL is less  than 70 and will need further aggressive lipid control therapy.  We also discussed heart healthy Mediterranean diet instead of having fried foods at all meals.  She is morbidly obese with a  BMI of 43.6.  I discussed the importance of weight loss as well as exercise with recommendations by the AHA of at least 5 days/week for 30 minutes duration.  We also discussed potential symptomatology associated with sleep apnea.  She does not sleep on her back.  Her Mallampati scale is 3/4 and she does snore.  I suspect she will ultimately require sleep evaluation but I will not schedule this presently.  I will see her back in follow-up of the above studies and further recommendations will be made at that time.   Medication Adjustments/Labs and Tests Ordered: Current medicines are reviewed at length with the patient today.  Concerns regarding medicines are outlined above.  Medication changes, Labs and Tests ordered today are listed in the Patient Instructions below. Patient Instructions  Medication Instructions:  Start pantoprazole 40 mg each day. Start amlodipine 5 mg each day. Start rosuvastatin 20 mg each day. Start aspirin 81 mg each day. *If you need a refill on your cardiac medications before your next appointment, please call your pharmacy*   Lab Work: Get blood work this week (CMET, lipids, lipoprotein a)  If you have labs (blood work) drawn today and your tests are completely normal, you will receive your results only by: [place] (if you have MyChart) OR A paper copy in the mail If you have any lab test that is abnormal or we need to change your treatment, we will call you to review the results.   Testing/Procedures: Your physician has requested that you have an echocardiogram. Echocardiography is a painless test that uses sound waves to create images of your heart. It provides your doctor with information about the size and shape of your heart and how well your heart's chambers and  valves are working. This procedure takes approximately one hour. There are no     Your cardiac CT will be scheduled at:   [facility] [place], [place] [address] [id number]   If scheduled at [facility], please arrive at the [facility] main entrance (entrance A) of [facility] 30 minutes prior to test start time. You can use the FREE valet parking offered at the main entrance (encouraged to control the heart rate for the test) Proceed to the [facility] Radiology Department (first floor) to check-in and test prep.   Please follow these instructions carefully (unless otherwise directed):   On the Night Before the Test: Be sure to Drink plenty of water. Do not consume any caffeinated/decaffeinated beverages or chocolate 12 hours prior to your test. Do not take any antihistamines 12 hours prior to your test.   On the Day of the Test: Drink plenty of water until 1 hour prior to the test. Do not eat any food 4 hours prior to the test. You may take your regular medications prior to the test.  Take metoprolol tartrate 100 mg two hours prior to test. FEMALES- please wear underwire-free bra if available, avoid dresses & tight clothing       After the Test: Drink plenty of water. After receiving IV contrast, you may experience a mild flushed feeling. This is normal. On occasion, you may experience a mild rash up to 24 hours after the test. This is not dangerous. If this occurs, you can take Benadryl 25 mg and increase your fluid intake. If you experience trouble breathing, this can be serious. If it is severe call 911 IMMEDIATELY. If it is mild, please call our office. If  you take any of these medications: Glipizide/Metformin, Avandament, Glucavance, please do not take 48 hours after completing test unless otherwise instructed.  Please allow 2-4 weeks for scheduling of routine cardiac CTs. Some insurance companies require a  pre-authorization which may delay scheduling of this test.   For non-scheduling related questions, please contact the cardiac imaging nurse navigator should you have any questions/concerns: Marchia Bond, Cardiac Imaging Nurse Navigator Gordy Clement, Cardiac Imaging Nurse Navigator [facility] Direct Office Dial: [phone]   For scheduling needs, including cancellations and rescheduling, please call [place], [phone].   Follow-Up: At [facility], you and your health needs are our priority.  As part of our continuing mission to provide you with exceptional heart care, we have created designated Provider Care Teams.  These Care Teams include your primary Cardiologist (physician) and Advanced Practice Providers (APPs -  Physician Assistants and Nurse Practitioners) who all work together to provide you with the care you need, when you need it.  We recommend signing up for the patient portal called "MyChart".  Sign up information is provided on this After Visit Summary.  MyChart is used to connect with patients for Virtual Visits (Telemedicine).  Patients are able to view lab/test results, encounter notes, upcoming appointments, etc.  Non-urgent messages can be sent to your provider as well.   To learn more about what you can do with MyChart, go to [email or web address].    Your next appointment:    Schedule after tests  The format for your next appointment:   In Person  Provider:   Dr. Corky Downs, MD     Signed, Shelva Majestic, MD  08/20/2021 12:05 PM    [facility] [address], [place], [place], [place]  [address] Phone: [phone]

## 2021-08-13 NOTE — Patient Instructions (Signed)
Medication Instructions:  Start pantoprazole 40 mg each day. Start amlodipine 5 mg each day. Start rosuvastatin 20 mg each day. Start aspirin 81 mg each day. *If you need a refill on your cardiac medications before your next appointment, please call your pharmacy*   Lab Work: Get blood work this week (CMET, lipids, lipoprotein a)  If you have labs (blood work) drawn today and your tests are completely normal, you will receive your results only by: [place] (if you have MyChart) OR A paper copy in the mail If you have any lab test that is abnormal or we need to change your treatment, we will call you to review the results.   Testing/Procedures: Your physician has requested that you have an echocardiogram. Echocardiography is a painless test that uses sound waves to create images of your heart. It provides your doctor with information about the size and shape of your heart and how well your heart's chambers and valves are working. This procedure takes approximately one hour. There are no     Your cardiac CT will be scheduled at:   [facility] [place], [place] [address] [phone]   If scheduled at [facility], please arrive at the [facility] main entrance (entrance A) of [facility] 30 minutes prior to test start time. You can use the FREE valet parking offered at the main entrance (encouraged to control the heart rate for the test) Proceed to the [facility] Radiology Department (first floor) to check-in and test prep.   Please follow these instructions carefully (unless otherwise directed):   On the Night Before the Test: Be sure to Drink plenty of water. Do not consume any caffeinated/decaffeinated beverages or chocolate 12 hours prior to your test. Do not take any antihistamines 12 hours prior to your test.   On the Day of the Test: Drink plenty of water until 1 hour prior to the test. Do not eat any food 4 hours  prior to the test. You may take your regular medications prior to the test.  Take metoprolol tartrate 100 mg two hours prior to test. FEMALES- please wear underwire-free bra if available, avoid dresses & tight clothing       After the Test: Drink plenty of water. After receiving IV contrast, you may experience a mild flushed feeling. This is normal. On occasion, you may experience a mild rash up to 24 hours after the test. This is not dangerous. If this occurs, you can take Benadryl 25 mg and increase your fluid intake. If you experience trouble breathing, this can be serious. If it is severe call 911 IMMEDIATELY. If it is mild, please call our office. If you take any of these medications: Glipizide/Metformin, Avandament, Glucavance, please do not take 48 hours after completing test unless otherwise instructed.  Please allow 2-4 weeks for scheduling of routine cardiac CTs. Some insurance companies require a pre-authorization which may delay scheduling of this test.   For non-scheduling related questions, please contact the cardiac imaging nurse navigator should you have any questions/concerns: Marchia Bond, Cardiac Imaging Nurse Navigator Gordy Clement, Cardiac Imaging Nurse Navigator [place] [facility] Direct Office Dial: [phone]   For scheduling needs, including cancellations and rescheduling, please call [place], [phone].   Follow-Up: At [facility], you and your health needs are our priority.  As part of our continuing mission to provide you with exceptional heart care, we have created designated Provider Care Teams.  These Care  Teams include your primary Cardiologist (physician) and Advanced Practice Providers (APPs -  Physician Assistants and Nurse Practitioners) who all work together to provide you with the care you need, when you need it.  We recommend signing up for the patient portal called "MyChart".  Sign up information is provided on this  After Visit Summary.  MyChart is used to connect with patients for Virtual Visits (Telemedicine).  Patients are able to view lab/test results, encounter notes, upcoming appointments, etc.  Non-urgent messages can be sent to your provider as well.   To learn more about what you can do with MyChart, go to [email or web address].    Your next appointment:    Schedule after tests  The format for your next appointment:   In Person  Provider:   Dr. Corky Downs, MD

## 2021-08-20 ENCOUNTER — Encounter: Payer: Self-pay | Admitting: Cardiovascular Disease

## 2021-09-02 DIAGNOSIS — Z1211 Encounter for screening for malignant neoplasm of colon: Secondary | ICD-10-CM | POA: Diagnosis not present

## 2021-09-05 ENCOUNTER — Other Ambulatory Visit: Payer: Self-pay

## 2021-09-05 ENCOUNTER — Ambulatory Visit (HOSPITAL_COMMUNITY): Payer: PPO | Attending: Cardiology

## 2021-09-05 DIAGNOSIS — E7800 Pure hypercholesterolemia, unspecified: Secondary | ICD-10-CM | POA: Diagnosis not present

## 2021-09-05 DIAGNOSIS — I10 Essential (primary) hypertension: Secondary | ICD-10-CM | POA: Diagnosis not present

## 2021-09-05 DIAGNOSIS — R072 Precordial pain: Secondary | ICD-10-CM | POA: Diagnosis not present

## 2021-09-05 LAB — ECHOCARDIOGRAM COMPLETE
Area-P 1/2: 3.87 cm2
S' Lateral: 2.7 cm

## 2021-09-07 LAB — COLOGUARD: COLOGUARD: NEGATIVE

## 2021-09-10 ENCOUNTER — Telehealth: Payer: Self-pay | Admitting: Internal Medicine

## 2021-09-10 ENCOUNTER — Telehealth: Payer: Self-pay | Admitting: Cardiovascular Disease

## 2021-09-10 NOTE — Telephone Encounter (Signed)
Spoke with patient. She reports having sciatica that has increased in pain since starting on rosuvastatin. She is not certain if it is muscle pain or the sciatica. Advised her to stop taking the medication for 2 weeks and to let clinic know how she is feeling after 2 weeks. She wanted clarification of what the CCTA was. Information provided, along with the number so she can get scheduled for it. Reminded her that she still needs to get blood work that was ordered on 08/13/21. Patient verbalized understanding of this conversation.

## 2021-09-10 NOTE — Telephone Encounter (Signed)
Pt c/o medication issue:  1. Name of Medication: Rosuvastatin, Amlodipine and Pantoprazole  2. How are you currently taking this medication (dosage and times per day)?   3. Are you having a reaction (difficulty breathing--STAT)?   4. What is your medication issue? Excruciating leg pain- patient have stopped taking all of these medicine as of Thursday

## 2021-09-10 NOTE — Telephone Encounter (Signed)
Patient states medication prescribed is making her sciatica worse  Patient does not know which medication is making it worse  Patient is requesting a call back

## 2021-09-10 NOTE — Telephone Encounter (Signed)
Called pt, LVM to discuss issues and make an appointment for follow up. Pt has not been seen after her ED visit on 11/9.

## 2021-10-08 ENCOUNTER — Telehealth (HOSPITAL_COMMUNITY): Payer: Self-pay | Admitting: *Deleted

## 2021-10-08 NOTE — Telephone Encounter (Signed)
Reaching out to patient to offer assistance regarding upcoming cardiac imaging study; pt verbalizes understanding of appt date/time, parking situation and where to check in, pre-test NPO status and medications ordered, and verified current allergies; name and call back number provided for further questions should they arise  Gordy Clement RN Navigator Cardiac Imaging [facility] [phone] office [phone] cell  Patient to take 100mg  metoprolol tartrate two hours prior to cardiac CT scan. She is aware to arrive at 11:30am for noon scan.

## 2021-10-10 ENCOUNTER — Other Ambulatory Visit: Payer: Self-pay

## 2021-10-10 ENCOUNTER — Encounter (HOSPITAL_COMMUNITY): Payer: Self-pay

## 2021-10-10 ENCOUNTER — Ambulatory Visit (HOSPITAL_COMMUNITY)
Admission: RE | Admit: 2021-10-10 | Discharge: 2021-10-10 | Disposition: A | Discharge status: Home / Self Care | Payer: PPO | Source: Ambulatory Visit | Attending: Cardiovascular Disease | Admitting: Cardiovascular Disease

## 2021-10-10 DIAGNOSIS — E7800 Pure hypercholesterolemia, unspecified: Secondary | ICD-10-CM | POA: Insufficient documentation

## 2021-10-10 DIAGNOSIS — R072 Precordial pain: Secondary | ICD-10-CM | POA: Diagnosis not present

## 2021-10-10 DIAGNOSIS — I10 Essential (primary) hypertension: Secondary | ICD-10-CM | POA: Insufficient documentation

## 2021-10-10 LAB — COMPREHENSIVE METABOLIC PANEL
ALT: 15 IU/L (ref 0–32)
AST: 16 IU/L (ref 0–40)
Albumin/Globulin Ratio: 1.6 (ref 1.2–2.2)
Albumin: 4.6 g/dL (ref 3.8–4.8)
Alkaline Phosphatase: 51 IU/L (ref 44–121)
BUN/Creatinine Ratio: 13 (ref 12–28)
BUN: 12 mg/dL (ref 8–27)
Bilirubin Total: 0.4 mg/dL (ref 0.0–1.2)
CO2: 25 mmol/L (ref 20–29)
Calcium: 9.7 mg/dL (ref 8.7–10.3)
Chloride: 101 mmol/L (ref 96–106)
Creatinine, Ser: 0.92 mg/dL (ref 0.57–1.00)
Globulin, Total: 2.8 g/dL (ref 1.5–4.5)
Glucose: 92 mg/dL (ref 70–99)
Potassium: 4.3 mmol/L (ref 3.5–5.2)
Sodium: 138 mmol/L (ref 134–144)
Total Protein: 7.4 g/dL (ref 6.0–8.5)
eGFR: 68 mL/min/{1.73_m2} (ref >59)

## 2021-10-10 LAB — LIPID PANEL
Chol/HDL Ratio: 4.5 ratio — ABNORMAL HIGH (ref 0.0–4.4)
Cholesterol, Total: 288 mg/dL — ABNORMAL HIGH (ref 100–199)
HDL: 64 mg/dL (ref >39)
LDL Chol Calc (NIH): 197 mg/dL — ABNORMAL HIGH (ref 0–99)
Triglycerides: 146 mg/dL (ref 0–149)
VLDL Cholesterol Cal: 27 mg/dL (ref 5–40)

## 2021-10-10 LAB — LIPOPROTEIN A (LPA): Lipoprotein (a): 90.7 nmol/L — ABNORMAL HIGH (ref <75.0)

## 2021-10-10 MED ORDER — NITROGLYCERIN 0.4 MG SL SUBL: 0.8000 mg | SUBLINGUAL_TABLET | Freq: Once | SUBLINGUAL | Status: DC

## 2021-10-10 MED ORDER — METOPROLOL TARTRATE 5 MG/5ML IV SOLN
INTRAVENOUS | Status: AC
  Administered 2021-10-10: 10 mg via INTRAVENOUS
  Filled 2021-10-10: qty 10

## 2021-10-10 MED ORDER — DILTIAZEM HCL 25 MG/5ML IV SOLN
INTRAVENOUS | Status: AC
  Filled 2021-10-10: qty 5

## 2021-10-10 MED ORDER — METOPROLOL TARTRATE 5 MG/5ML IV SOLN: 5.0000 mg | INTRAVENOUS | Status: DC | PRN

## 2021-10-10 NOTE — Progress Notes (Signed)
Patient at radiology for CT cardiac exam. CT heart exam cancelled due to heart rate 80-90s after giving prn dose of Lopressor. Patient states she feels nervous about receiving Nitroglycerin. Dr. Gasper Sells made aware and decision made to cancel exam due to elevated heart rate. Patient made aware and states understanding. CT heart navigator nurse Kerin Ransom) made aware.

## 2021-10-11 ENCOUNTER — Other Ambulatory Visit (HOSPITAL_COMMUNITY): Payer: Self-pay | Admitting: *Deleted

## 2021-10-11 MED ORDER — METOPROLOL TARTRATE 100 MG PO TABS: ORAL_TABLET | ORAL | 0 refills | Status: DC

## 2021-10-19 ENCOUNTER — Encounter (HOSPITAL_COMMUNITY): Payer: Self-pay

## 2021-10-22 ENCOUNTER — Ambulatory Visit (HOSPITAL_COMMUNITY): Admission: RE | Admit: 2021-10-22 | Payer: PPO | Source: Ambulatory Visit

## 2021-10-30 ENCOUNTER — Telehealth: Payer: Self-pay | Admitting: Internal Medicine

## 2021-10-30 NOTE — Telephone Encounter (Signed)
Pt states her right eye is swollen and sore  Offered pt an appt for tomorrow 2-1, pt declined stating she will go to [facility]

## 2021-10-31 ENCOUNTER — Other Ambulatory Visit: Payer: Self-pay

## 2021-10-31 ENCOUNTER — Ambulatory Visit (HOSPITAL_COMMUNITY)
Admission: EM | Admit: 2021-10-31 | Discharge: 2021-10-31 | Disposition: A | Discharge status: Home / Self Care | Payer: PPO | Attending: Emergency Medicine | Admitting: Emergency Medicine

## 2021-10-31 ENCOUNTER — Encounter (HOSPITAL_COMMUNITY): Payer: Self-pay | Admitting: *Deleted

## 2021-10-31 DIAGNOSIS — H00011 Hordeolum externum right upper eyelid: Secondary | ICD-10-CM | POA: Diagnosis not present

## 2021-10-31 MED ORDER — TOBRAMYCIN 0.3 % OP SOLN: 1.0000 [drp] | OPHTHALMIC | 0 refills | Status: DC

## 2021-10-31 NOTE — ED Triage Notes (Signed)
Pt reports swelling to Rt upper eye lid that started on Sunday.

## 2021-10-31 NOTE — ED Provider Notes (Signed)
[place]    CSN: [id number] Arrival date & time: 10/31/21  0847      History   Chief Complaint Chief Complaint  Patient presents with   rt eye upper lid swelling    HPI CALLISTA Alexandra Robinson is a 66 y.o. female. Reports R upper eyelid swelling for 3 days. Reports it is improving but worried it is infected.   HPI  Past Medical History:  Diagnosis Date   Primary hypertension 07/11/2021    Patient Active Problem List   Diagnosis Date Noted   Pressure in left side of chest 07/11/2021   Primary hypertension 07/11/2021   Hyperlipidemia with target LDL less than 100 07/11/2021   Encounter for general adult medical examination with abnormal findings 07/11/2021   Colon cancer screening 07/11/2021   Visit for screening mammogram 07/11/2021   Need for prophylactic vaccination with combined diphtheria-tetanus-pertussis (DTP) vaccine 07/11/2021   Screening for HIV (human immunodeficiency virus) 07/11/2021   Need for hepatitis C screening test 07/11/2021   Abnormal electrocardiogram (ECG) (EKG) 07/11/2021   Class 3 severe obesity due to excess calories with serious comorbidity and body mass index (BMI) of 40.0 to 44.9 in adult [facility]) 07/11/2021   Need for shingles vaccine 07/11/2021   Prediabetes 07/11/2021   Ductal carcinoma in situ (DCIS) of left breast 06/29/2018    Past Surgical History:  Procedure Laterality Date   APPENDECTOMY     BREAST CYST EXCISION      OB History   No obstetric history on file.      Home Medications    Prior to Admission medications   Medication Sig Start Date End Date Taking? Authorizing Provider  tobramycin (TOBREX) 0.3 % ophthalmic solution Place 1 drop into the right eye every 4 (four) hours. 10/31/21  Yes Carvel Getting, NP  amLODipine (NORVASC) 5 MG tablet Take 1 tablet (5 mg total) by mouth daily. 08/13/21   Troy Sine, MD  aspirin EC 81 MG tablet Take 1 tablet (81 mg total) by mouth daily. Swallow whole. 08/13/21   Troy Sine, MD  metoprolol tartrate (LOPRESSOR) 100 MG tablet Take 2 hours before test. 10/11/21   Troy Sine, MD  pantoprazole (PROTONIX) 40 MG tablet Take 1 tablet (40 mg total) by mouth daily. 08/13/21   Troy Sine, MD  rosuvastatin (CRESTOR) 20 MG tablet Take 1 tablet (20 mg total) by mouth daily. 08/13/21   Troy Sine, MD    Family History Family History  Problem Relation Age of Onset   Breast cancer Mother        late 48s    Diabetes Father    Alcoholism Father    Coronary artery disease Brother    Renal cancer Brother     Social History Social History   Tobacco Use   Smoking status: Never   Smokeless tobacco: Never  Substance Use Topics   Alcohol use: No   Drug use: Not Currently     Allergies   Bactrim [sulfamethoxazole-trimethoprim]   Review of Systems Review of Systems  Constitutional:  Negative for chills and fever.  HENT:  Negative for congestion.   Eyes:  Positive for redness. Negative for photophobia, pain, discharge and visual disturbance.    Physical Exam Triage Vital Signs ED Triage Vitals  Enc Vitals Group     BP 10/31/21 1003 126/77     Pulse Rate 10/31/21 1003 67     Resp 10/31/21 1003 18     Temp  10/31/21 1003 98.2 F (36.8 C)     Temp src --      SpO2 10/31/21 1003 95 %     Weight --      Height --      Head Circumference --      Peak Flow --      Pain Score 10/31/21 1002 10     Pain Loc --      Pain Edu? --      Excl. in [place]? --    No data found.  Updated Vital Signs BP 126/77    Pulse 67    Temp 98.2 F (36.8 C)    Resp 18    SpO2 95%   Visual Acuity Right Eye Distance: 20/50 with correction Left Eye Distance: 20/50 with correction Bilateral Distance: 20/50 with correction  Right Eye Near:   Left Eye Near:    Bilateral Near:     Physical Exam Constitutional:      Appearance: Normal appearance. She is not ill-appearing.  Eyes:     General:        Right eye: Hordeolum present.     Conjunctiva/sclera:      Right eye: Right conjunctiva is injected. No chemosis, exudate or hemorrhage. Pulmonary:     Effort: Pulmonary effort is normal.  Neurological:     Mental Status: She is alert.     UC Treatments / Results  Labs (all labs ordered are listed, but only abnormal results are displayed) Labs Reviewed - No data to display  EKG   Radiology No results found.  Procedures Procedures (including critical care time)  Medications Ordered in UC Medications - No data to display  Initial Impression / Assessment and Plan / UC Course  I have reviewed the triage vital signs and the nursing notes.  Pertinent labs & imaging results that were available during my care of the patient were reviewed by me and considered in my medical decision making (see chart for details).    Discussed home management, ok to work and be around others.   Final Clinical Impressions(s) / UC Diagnoses   Final diagnoses:  Hordeolum externum of right upper eyelid     Discharge Instructions      Use a warm compress at least twice a day with clean washcloth each time.  You are okay to work in [place] people as long as you are careful about not touching your eyes and regular handwashing   ED Prescriptions     Medication Sig Dispense Auth. Provider   tobramycin (TOBREX) 0.3 % ophthalmic solution Place 1 drop into the right eye every 4 (four) hours. 5 mL Carvel Getting, NP      PDMP not reviewed this encounter.   Carvel Getting, NP 10/31/21 1041

## 2021-10-31 NOTE — Discharge Instructions (Signed)
Use a warm compress at least twice a day with clean washcloth each time.  You are okay to work in [place] people as long as you are careful about not touching your eyes and regular handwashing

## 2021-11-01 ENCOUNTER — Ambulatory Visit: Payer: PPO | Admitting: Nurse Practitioner

## 2022-03-20 ENCOUNTER — Ambulatory Visit (INDEPENDENT_AMBULATORY_CARE_PROVIDER_SITE_OTHER): Payer: PPO | Admitting: Internal Medicine

## 2022-03-20 ENCOUNTER — Encounter: Payer: Self-pay | Admitting: Internal Medicine

## 2022-03-20 VITALS — BP 136/82 | HR 65 | Temp 97.7°F | Ht 68.0 in | Wt 269.0 lb

## 2022-03-20 DIAGNOSIS — E785 Hyperlipidemia, unspecified: Secondary | ICD-10-CM

## 2022-03-20 DIAGNOSIS — R0683 Snoring: Secondary | ICD-10-CM | POA: Diagnosis not present

## 2022-03-20 DIAGNOSIS — R7303 Prediabetes: Secondary | ICD-10-CM

## 2022-03-20 DIAGNOSIS — I10 Essential (primary) hypertension: Secondary | ICD-10-CM | POA: Diagnosis not present

## 2022-03-20 DIAGNOSIS — Z1231 Encounter for screening mammogram for malignant neoplasm of breast: Secondary | ICD-10-CM

## 2022-03-20 DIAGNOSIS — F321 Major depressive disorder, single episode, moderate: Secondary | ICD-10-CM | POA: Insufficient documentation

## 2022-03-20 LAB — BASIC METABOLIC PANEL
BUN: 9 mg/dL (ref 6–23)
CO2: 30 mEq/L (ref 19–32)
Calcium: 9.7 mg/dL (ref 8.4–10.5)
Chloride: 103 mEq/L (ref 96–112)
Creatinine, Ser: 0.8 mg/dL (ref 0.40–1.20)
GFR: 75.71 mL/min (ref >60.00)
Glucose, Bld: 87 mg/dL (ref 70–99)
Potassium: 3.7 mEq/L (ref 3.5–5.1)
Sodium: 138 mEq/L (ref 135–145)

## 2022-03-20 LAB — TSH: TSH: 3.15 u[IU]/mL (ref 0.35–5.50)

## 2022-03-20 LAB — HEMOGLOBIN A1C: Hgb A1c MFr Bld: 5.9 % (ref 4.6–6.5)

## 2022-03-20 MED ORDER — DULOXETINE HCL 30 MG PO CPEP: 30.0000 mg | ORAL_CAPSULE | Freq: Every day | ORAL | 0 refills | Status: DC

## 2022-03-20 MED ORDER — BUPROPION HCL ER (XL) 150 MG PO TB24: 150.0000 mg | ORAL_TABLET | Freq: Every day | ORAL | 0 refills | Status: DC

## 2022-03-20 MED ORDER — ROSUVASTATIN CALCIUM 20 MG PO TABS: 20.0000 mg | ORAL_TABLET | Freq: Every day | ORAL | 1 refills | Status: DC

## 2022-03-20 NOTE — Progress Notes (Unsigned)
Subjective:  Patient ID: Alexandra Robinson, female    DOB: 15-Aug-1956  Age: 66 y.o. MRN: [id number]  CC: Depression, Hypertension, and Hyperlipidemia   HPI Alexandra Robinson presents for ***  Outpatient Medications Prior to Visit  Medication Sig Dispense Refill   aspirin EC 81 MG tablet Take 1 tablet (81 mg total) by mouth daily. Swallow whole. 90 tablet 3   amLODipine (NORVASC) 5 MG tablet Take 1 tablet (5 mg total) by mouth daily. (Patient not taking: Reported on 03/20/2022) 90 tablet 3   pantoprazole (PROTONIX) 40 MG tablet Take 1 tablet (40 mg total) by mouth daily. (Patient not taking: Reported on 03/20/2022) 90 tablet 3   metoprolol tartrate (LOPRESSOR) 100 MG tablet Take 2 hours before test. (Patient not taking: Reported on 03/20/2022) 1 tablet 0   rosuvastatin (CRESTOR) 20 MG tablet Take 1 tablet (20 mg total) by mouth daily. (Patient not taking: Reported on 03/20/2022) 90 tablet 3   tobramycin (TOBREX) 0.3 % ophthalmic solution Place 1 drop into the right eye every 4 (four) hours. (Patient not taking: Reported on 03/20/2022) 5 mL 0   No facility-administered medications prior to visit.    ROS Review of Systems  Objective:  BP 136/82 (BP Location: Left Arm, Patient Position: Sitting, Cuff Size: Large)   Pulse 65   Temp 97.7 F (36.5 C) (Oral)   Ht '5\' 8"'$  (1.727 m)   Wt 269 lb (122 kg)   SpO2 98%   BMI 40.90 kg/m   BP Readings from Last 3 Encounters:  03/20/22 136/82  10/31/21 126/77  10/10/21 117/81    Wt Readings from Last 3 Encounters:  03/20/22 269 lb (122 kg)  08/13/21 287 lb (130.2 kg)  07/11/21 286 lb (129.7 kg)    Physical Exam Vitals reviewed.  HENT:     Nose: Nose normal.     Mouth/Throat:     Mouth: Mucous membranes are moist.  Eyes:     General: No scleral icterus.    Conjunctiva/sclera: Conjunctivae normal.  Cardiovascular:     Rate and Rhythm: Normal rate and regular rhythm.     Heart sounds: No murmur heard.    Comments: EKG- NSR with 1st  degree AV block, 66 bpm No LVH or Q waves Pulmonary:     Effort: Pulmonary effort is normal.     Breath sounds: No stridor. No wheezing, rhonchi or rales.  Abdominal:     General: Abdomen is protuberant. Bowel sounds are normal.     Palpations: There is no mass.     Tenderness: There is no abdominal tenderness. There is no guarding.     Hernia: No hernia is present.  Musculoskeletal:        General: Normal range of motion.     Cervical back: Neck supple.     Right lower leg: No edema.     Left lower leg: No edema.  Lymphadenopathy:     Cervical: No cervical adenopathy.  Skin:    General: Skin is warm and dry.  Neurological:     General: No focal deficit present.     Mental Status: She is alert. Mental status is at baseline.  Psychiatric:        Attention and Perception: Attention and perception normal.        Mood and Affect: Mood is depressed. Affect is flat.        Speech: She is communicative. Speech is delayed. Speech is not rapid and pressured or tangential.  Behavior: Behavior normal. Behavior is cooperative.        Thought Content: Thought content normal. Thought content is not paranoid or delusional. Thought content does not include homicidal or suicidal ideation.        Cognition and Memory: Cognition normal.     Lab Results  Component Value Date   WBC 6.7 08/08/2021   HGB 13.9 08/08/2021   HCT 43.0 08/08/2021   PLT 282 08/08/2021   GLUCOSE 92 10/09/2021   CHOL 288 (H) 10/09/2021   TRIG 146 10/09/2021   HDL 64 10/09/2021   LDLCALC 197 (H) 10/09/2021   ALT 15 10/09/2021   AST 16 10/09/2021   NA 138 10/09/2021   K 4.3 10/09/2021   CL 101 10/09/2021   CREATININE 0.92 10/09/2021   BUN 12 10/09/2021   CO2 25 10/09/2021   TSH 3.33 07/11/2021   HGBA1C 6.0 07/11/2021    No results found.  Assessment & Plan:   Alexandra Robinson was seen today for depression, hypertension and hyperlipidemia.  Diagnoses and all orders for this visit:  Primary hypertension -      EKG 12-Lead -     Basic metabolic panel; Future -     TSH; Future  Hyperlipidemia with target LDL less than 100 -     TSH; Future -     rosuvastatin (CRESTOR) 20 MG tablet; Take 1 tablet (20 mg total) by mouth daily.  Prediabetes -     Basic metabolic panel; Future -     Hemoglobin A1c; Future  Visit for screening mammogram -     MM DIGITAL SCREENING BILATERAL; Future  Loud snoring -     Ambulatory referral to Sleep Studies  Current moderate episode of major depressive disorder without prior episode (HCC) -     buPROPion (WELLBUTRIN XL) 150 MG 24 hr tablet; Take 1 tablet (150 mg total) by mouth daily. -     DULoxetine (CYMBALTA) 30 MG capsule; Take 1 capsule (30 mg total) by mouth daily.  Precordial pain  Pure hypercholesterolemia   I have discontinued Alexandra Robinson's metoprolol tartrate and tobramycin. I am also having her start on buPROPion and DULoxetine. Additionally, I am having her maintain her aspirin EC, amLODipine, pantoprazole, and rosuvastatin.  Meds ordered this encounter  Medications   buPROPion (WELLBUTRIN XL) 150 MG 24 hr tablet    Sig: Take 1 tablet (150 mg total) by mouth daily.    Dispense:  30 tablet    Refill:  0   DULoxetine (CYMBALTA) 30 MG capsule    Sig: Take 1 capsule (30 mg total) by mouth daily.    Dispense:  30 capsule    Refill:  0   rosuvastatin (CRESTOR) 20 MG tablet    Sig: Take 1 tablet (20 mg total) by mouth daily.    Dispense:  90 tablet    Refill:  1     Follow-up: Return in about 3 months (around 06/20/2022).  Scarlette Calico, MD

## 2022-03-20 NOTE — Patient Instructions (Signed)
Major Depressive Disorder, Adult Major depressive disorder (MDD) is a mental health condition. It may also be called clinical depression or unipolar depression. MDD causes symptoms of sadness, hopelessness, and loss of interest in things. These symptoms last most of the day, almost every day, for 2 weeks. MDD can also cause physical symptoms. It can interfere with relationships and with everyday activities, such as work, school, and activities that are usually pleasant. MDD may be mild, moderate, or severe. It may be single-episode MDD, which happens once, or recurrent MDD, which may occur multiple times. What are the causes? The exact cause of this condition is not known. MDD is most likely caused by a combination of things, which may include: Your personality traits. Learned or conditioned behaviors or thoughts or feelings that reinforce negativity. Any alcohol or substance misuse. Long-term (chronic) physical or mental health illness. Going through a traumatic experience or major life changes. What increases the risk? The following factors may make someone more likely to develop MDD: A family history of depression. Being a woman. Troubled family relationships. Abnormally low levels of certain brain chemicals. Traumatic or painful events in childhood, especially abuse or loss of a parent. A lot of stress from life experiences, such as poor living conditions or discrimination. Chronic physical illness or other mental health disorders. What are the signs or symptoms? The main symptoms of MDD usually include: Constant depressed or irritable mood. A loss of interest in things and activities. Other symptoms include: Sleeping or eating too much or too little. Unexplained weight gain or weight loss. Tiredness or low energy. Being agitated, restless, or weak. Feeling hopeless, worthless, or guilty. Trouble thinking clearly or making decisions. Thoughts of suicide or thoughts of harming  others. Isolating oneself or avoiding other people or activities. Trouble completing tasks, work, or any normal obligations. Severe symptoms of this condition may include: Psychotic depression.This may include false beliefs, or delusions. It may also include seeing, hearing, tasting, smelling, or feeling things that are not real (hallucinations). Chronic depression or persistent depressive disorder. This is low-level depression that lasts for at least 2 years. Melancholic depression, or feeling extremely sad and hopeless. Catatonic depression, which includes trouble speaking and trouble moving. How is this diagnosed? This condition may be diagnosed based on: Your symptoms. Your medical and mental health history. You may be asked questions about your lifestyle, including any drug and alcohol use. A physical exam. Blood tests to rule out other conditions. MDD is confirmed if you have the following symptoms most of the day, nearly every day, in a 2-week period: Either a depressed mood or loss of interest. At least four other MDD symptoms. How is this treated? This condition is usually treated by mental health professionals, such as psychologists, psychiatrists, and clinical social workers. You may need more than one type of treatment. Treatment may include: Psychotherapy, also called talk therapy or counseling. Types of psychotherapy include: Cognitive behavioral therapy (CBT). This teaches you to recognize unhealthy feelings, thoughts, and behaviors, and replace them with positive thoughts and actions. Interpersonal therapy (IPT). This helps you to improve the way you communicate with others or relate to them. Family therapy. This treatment includes members of your family. Medicines to treat anxiety and depression. These medicines help to balance the brain chemicals that affect your emotions. Lifestyle changes. You may be asked to: Limit alcohol use and avoid drug use. Get regular  exercise. Get plenty of sleep. Make healthy eating choices. Spend more time outdoors. Brain stimulation. This may   be done if symptoms are very severe and other treatments have not worked. Examples of this treatment are electroconvulsive therapy and transcranial magnetic stimulation. Follow these instructions at home: Activity Exercise regularly and spend time outdoors. Find activities that you enjoy doing, and make time to do them. Find healthy ways to manage stress, such as: Meditation or deep breathing. Spending time in nature. Journaling. Return to your normal activities as told by your health care provider. Ask your health care provider what activities are safe for you. Alcohol and drug use If you drink alcohol: Limit how much you use to: 0-1 drink a day for women who are not pregnant. 0-2 drinks a day for men. Be aware of how much alcohol is in your drink. In the [place] one drink equals one 12 oz bottle of beer (355 mL), one 5 oz glass of wine (148 mL), or one 1 oz glass of hard liquor (44 mL). Discuss your alcohol use with your health care provider. Alcohol can affect any antidepressant medicines you are taking. Discuss any drug use with your health care provider. General instructions  Take over-the-counter and prescription medicines only as told by your health care provider. Eat a healthy diet and get plenty of sleep. Consider joining a support group. Your health care provider may be able to recommend one. Keep all follow-up visits as told by your health care provider. This is important. Where to find more information National Alliance on Mental Illness: www.nami.org [place] National Institute of Mental Health: www.nimh.nih.gov Contact a health care provider if: Your symptoms get worse. You develop new symptoms. Get help right away if: You self-harm. You have serious thoughts about hurting yourself or others. You hallucinate. If you ever feel like you may hurt yourself or  others, or have thoughts about taking your own life, get help right away. Go to your nearest emergency department or: Call your local emergency services (911 in the [place] Call a suicide crisis helpline, such as the National Suicide Prevention Lifeline at 1-800-273-8255 or 988 in the [place] This is open 24 hours a day in the [place] Text the Crisis Text Line at 741741 (in the [place] Summary Major depressive disorder (MDD) is a mental health condition. MDD causes symptoms of sadness, hopelessness, and loss of interest in things. These symptoms last most of the day, almost every day, for 2 weeks. The symptoms of MDD can interfere with relationships and with everyday activities. Treatments and support are available for people who develop MDD. You may need more than one type of treatment. Get help right away if you have serious thoughts about hurting yourself or others. This information is not intended to replace advice given to you by your health care provider. Make sure you discuss any questions you have with your health care provider. Document Revised: 04/11/2021 Document Reviewed: 08/28/2019 Elsevier Patient Education  2023 [employer] Inc.  

## 2022-04-24 ENCOUNTER — Telehealth: Payer: Self-pay | Admitting: Cardiovascular Disease

## 2022-04-24 DIAGNOSIS — E785 Hyperlipidemia, unspecified: Secondary | ICD-10-CM

## 2022-04-24 DIAGNOSIS — E7800 Pure hypercholesterolemia, unspecified: Secondary | ICD-10-CM

## 2022-04-24 NOTE — Telephone Encounter (Signed)
Spoke to patient she was calling about lipid panel done 10/09/21.Results given.Stated she never started Rosuvastatin 20 mg.Stated she saw PCP recently and he sent in a new prescription,but she has not started taking.Patient advised she needs to take Rosuvastatin 20 mg daily.Repeat fasting lipid panel in 3 months.Lab orders mailed along with low cholesterol low fat guidelines.

## 2022-04-24 NOTE — Telephone Encounter (Signed)
Noted! Thank you

## 2022-04-24 NOTE — Telephone Encounter (Signed)
Pt would like for nurse to return call regarding test results. Please advise

## 2022-09-03 ENCOUNTER — Telehealth: Payer: Self-pay | Admitting: Internal Medicine

## 2022-09-03 NOTE — Telephone Encounter (Signed)
Left message for patient to call back to schedule Medicare Annual Wellness Visit   No hx of AWV eligible as of 10/10/09  Please schedule at anytime with [facility] Advisor if patient calls the office back.     Any questions, please call me at [phone]

## 2022-12-05 ENCOUNTER — Encounter: Payer: Self-pay | Admitting: Family Medicine

## 2022-12-05 ENCOUNTER — Ambulatory Visit (INDEPENDENT_AMBULATORY_CARE_PROVIDER_SITE_OTHER): Payer: PPO | Admitting: Family Medicine

## 2022-12-05 VITALS — BP 136/84 | HR 70 | Temp 97.6°F | Ht 68.0 in | Wt 272.0 lb

## 2022-12-05 DIAGNOSIS — R072 Precordial pain: Secondary | ICD-10-CM | POA: Insufficient documentation

## 2022-12-05 DIAGNOSIS — E7800 Pure hypercholesterolemia, unspecified: Secondary | ICD-10-CM

## 2022-12-05 DIAGNOSIS — I10 Essential (primary) hypertension: Secondary | ICD-10-CM

## 2022-12-05 DIAGNOSIS — R0789 Other chest pain: Secondary | ICD-10-CM | POA: Diagnosis not present

## 2022-12-05 DIAGNOSIS — K219 Gastro-esophageal reflux disease without esophagitis: Secondary | ICD-10-CM | POA: Diagnosis not present

## 2022-12-05 DIAGNOSIS — R9431 Abnormal electrocardiogram [ECG] [EKG]: Secondary | ICD-10-CM

## 2022-12-05 DIAGNOSIS — Z91199 Patient's noncompliance with other medical treatment and regimen due to unspecified reason: Secondary | ICD-10-CM | POA: Insufficient documentation

## 2022-12-05 LAB — LIPID PANEL
Cholesterol: 279 mg/dL — ABNORMAL HIGH (ref 0–200)
HDL: 64.5 mg/dL (ref >39.00)
LDL Cholesterol: 189 mg/dL — ABNORMAL HIGH (ref 0–99)
NonHDL: 214.24
Total CHOL/HDL Ratio: 4
Triglycerides: 126 mg/dL (ref 0.0–149.0)
VLDL: 25.2 mg/dL (ref 0.0–40.0)

## 2022-12-05 LAB — CBC WITH DIFFERENTIAL/PLATELET
Basophils Absolute: 0 10*3/uL (ref 0.0–0.1)
Basophils Relative: 1 % (ref 0.0–3.0)
Eosinophils Absolute: 0.1 10*3/uL (ref 0.0–0.7)
Eosinophils Relative: 2.3 % (ref 0.0–5.0)
HCT: 43.4 % (ref 36.0–46.0)
Hemoglobin: 14.5 g/dL (ref 12.0–15.0)
Lymphocytes Relative: 39 % (ref 12.0–46.0)
Lymphs Abs: 1.7 10*3/uL (ref 0.7–4.0)
MCHC: 33.3 g/dL (ref 30.0–36.0)
MCV: 93.1 fl (ref 78.0–100.0)
Monocytes Absolute: 0.3 10*3/uL (ref 0.1–1.0)
Monocytes Relative: 7.7 % (ref 3.0–12.0)
Neutro Abs: 2.2 10*3/uL (ref 1.4–7.7)
Neutrophils Relative %: 50 % (ref 43.0–77.0)
Platelets: 270 10*3/uL (ref 150.0–400.0)
RBC: 4.66 Mil/uL (ref 3.87–5.11)
RDW: 13.8 % (ref 11.5–15.5)
WBC: 4.5 10*3/uL (ref 4.0–10.5)

## 2022-12-05 LAB — COMPREHENSIVE METABOLIC PANEL
ALT: 15 U/L (ref 0–35)
AST: 17 U/L (ref 0–37)
Albumin: 4.1 g/dL (ref 3.5–5.2)
Alkaline Phosphatase: 45 U/L (ref 39–117)
BUN: 9 mg/dL (ref 6–23)
CO2: 29 mEq/L (ref 19–32)
Calcium: 10.1 mg/dL (ref 8.4–10.5)
Chloride: 104 mEq/L (ref 96–112)
Creatinine, Ser: 0.87 mg/dL (ref 0.40–1.20)
GFR: 68.11 mL/min (ref >60.00)
Glucose, Bld: 97 mg/dL (ref 70–99)
Potassium: 4.1 mEq/L (ref 3.5–5.1)
Sodium: 138 mEq/L (ref 135–145)
Total Bilirubin: 0.4 mg/dL (ref 0.2–1.2)
Total Protein: 7.5 g/dL (ref 6.0–8.3)

## 2022-12-05 LAB — TSH: TSH: 2.57 u[IU]/mL (ref 0.35–5.50)

## 2022-12-05 MED ORDER — ATORVASTATIN CALCIUM 20 MG PO TABS: 20.0000 mg | ORAL_TABLET | Freq: Every day | ORAL | 1 refills | Status: DC

## 2022-12-05 MED ORDER — PANTOPRAZOLE SODIUM 40 MG PO TBEC: 40.0000 mg | DELAYED_RELEASE_TABLET | Freq: Every day | ORAL | 0 refills | Status: DC

## 2022-12-05 NOTE — Assessment & Plan Note (Signed)
Discussed that her chest pain may be related to acid reflux.  Recommend she start taking pantoprazole daily for the next 4 weeks.   Discussed avoiding triggers.  Discussed not eating and laying down within 2 hours.  Follow-up with PCP

## 2022-12-05 NOTE — Progress Notes (Signed)
Subjective:     Patient ID: Alexandra Robinson, female    DOB: [birth year], 67 y.o.   MRN: [id number]  Chief Complaint  Patient presents with   Chest Pain    Chest pressure on and off not consistently for about 3 months, mostly in the evening    Chest Pain    Patient is in today for a 3 month hx of intermittent substernal chest pressure. Occurs some evenings, usually 3 days per week on average. The pain lasts only a second or two and resolves spontaneously. Pain is non radiating. No dizziness, palpitations, shortness of breath, orthopnea, N/V, LE edema.   States she is not taking amlodipine and her BP is always fine at her visits.  Does not check BP at home.   States she does not take Crestor due to feeling stiff.   LDL 197 in 2023  States her mood is good. Has all good days. Cries occasionally and feels fine.  States she does not take Wellbutrin or Cymbalta.   States she does not eat during the day and then overeats in the evening.  Has not been taking Protonix.     Health Maintenance Due  Topic Date Due   COVID-19 Vaccine (1) Never done   DTaP/Tdap/Td (1 - Tdap) Never done   Zoster Vaccines- Shingrix (1 of 2) Never done   COLONOSCOPY (Pts 45-93yr Insurance coverage will need to be confirmed)  Never done   Pneumonia Vaccine 67 Years old (1 of 1 - PCV) Never done   DEXA SCAN  Never done   MAMMOGRAM  02/26/2020   Medicare Annual Wellness (AWV)  12/07/2020   INFLUENZA VACCINE  Never done    Past Medical History:  Diagnosis Date   Primary hypertension 07/11/2021    Past Surgical History:  Procedure Laterality Date   APPENDECTOMY     BREAST CYST EXCISION      Family History  Problem Relation Age of Onset   Breast cancer Mother        late 518s   Diabetes Father    Alcoholism Father    Coronary artery disease Brother    Renal cancer Brother     Social History   Socioeconomic History   Marital status: Widowed    Spouse name: Not on file   Number of  children: Not on file   Years of education: Not on file   Highest education level: Not on file  Occupational History   Not on file  Tobacco Use   Smoking status: Never   Smokeless tobacco: Never  Substance and Sexual Activity   Alcohol use: No   Drug use: Not Currently   Sexual activity: Not Currently    Partners: Male  Other Topics Concern   Not on file  Social History Narrative   Not on file   Social Determinants of Health   Financial Resource Strain: Not on file  Food Insecurity: Not on file  Transportation Needs: Not on file  Physical Activity: Not on file  Stress: Not on file  Social Connections: Not on file  Intimate Partner Violence: Not on file    Outpatient Medications Prior to Visit  Medication Sig Dispense Refill   amLODipine (NORVASC) 5 MG tablet Take 1 tablet (5 mg total) by mouth daily. (Patient not taking: Reported on 03/20/2022) 90 tablet 3   aspirin EC 81 MG tablet Take 1 tablet (81 mg total) by mouth daily. Swallow whole. (Patient not taking: Reported on 12/05/2022) 90  tablet 3   buPROPion (WELLBUTRIN XL) 150 MG 24 hr tablet Take 1 tablet (150 mg total) by mouth daily. (Patient not taking: Reported on 12/05/2022) 30 tablet 0   DULoxetine (CYMBALTA) 30 MG capsule Take 1 capsule (30 mg total) by mouth daily. (Patient not taking: Reported on 12/05/2022) 30 capsule 0   pantoprazole (PROTONIX) 40 MG tablet Take 1 tablet (40 mg total) by mouth daily. (Patient not taking: Reported on 03/20/2022) 90 tablet 3   rosuvastatin (CRESTOR) 20 MG tablet Take 1 tablet (20 mg total) by mouth daily. (Patient not taking: Reported on 12/05/2022) 90 tablet 1   No facility-administered medications prior to visit.    Allergies  Allergen Reactions   Bactrim [Sulfamethoxazole-Trimethoprim] Hives    Review of Systems  Cardiovascular:  Positive for chest pain.       Objective:    Physical Exam Constitutional:      General: She is not in acute distress.    Appearance: She is obese.  She is not ill-appearing.  Eyes:     Extraocular Movements: Extraocular movements intact.     Pupils: Pupils are equal, round, and reactive to light.  Neck:     Vascular: No JVD.  Cardiovascular:     Rate and Rhythm: Normal rate and regular rhythm.     Heart sounds: Normal heart sounds.  Pulmonary:     Effort: Pulmonary effort is normal.     Breath sounds: Normal breath sounds.  Chest:     Chest wall: No tenderness.  Abdominal:     General: Bowel sounds are normal.     Palpations: Abdomen is soft.     Tenderness: There is no abdominal tenderness. There is no guarding or rebound.  Musculoskeletal:        General: Normal range of motion.     Cervical back: Normal range of motion.     Right lower leg: No tenderness. No edema.     Left lower leg: No tenderness. No edema.  Neurological:     Mental Status: She is alert.     BP 136/84 (BP Location: Left Arm, Patient Position: Sitting, Cuff Size: Large)   Pulse 70   Temp 97.6 F (36.4 C) (Temporal)   Ht '5\' 8"'$  (1.727 m)   Wt 272 lb (123.4 kg)   SpO2 97%   BMI 41.36 kg/m  Wt Readings from Last 3 Encounters:  12/05/22 272 lb (123.4 kg)  03/20/22 269 lb (122 kg)  08/13/21 287 lb (130.2 kg)       Assessment & Plan:   Problem List Items Addressed This Visit       Cardiovascular and Mediastinum   Primary hypertension    States she does not take amlodipine and her blood pressure is always fine at her visits.  She does not check her blood pressure at home.  Check BMP.  Follow-up with PCP      Relevant Medications   atorvastatin (LIPITOR) 20 MG tablet   pantoprazole (PROTONIX) 40 MG tablet   Other Relevant Orders   CBC with Differential/Platelet   Comprehensive metabolic panel   TSH     Digestive   Gastroesophageal reflux disease    Discussed that her chest pain may be related to acid reflux.  Recommend she start taking pantoprazole daily for the next 4 weeks.   Discussed avoiding triggers.  Discussed not eating and  laying down within 2 hours.  Follow-up with PCP      Relevant Medications   pantoprazole (  PROTONIX) 40 MG tablet     Other   Abnormal electrocardiogram    EKG with sinus rhythm, first-degree AV block.  Rate 91.  Unchanged from old EKG      Hypercholesterolemia with LDL greater than 190 mg/dL    Discussed significant risk of heart disease, heart attack and stroke with LDL greater than 190.  Reportedly had myalgias with rosuvastatin.  Is willing to try different statin.  Atorvastatin prescribed.  Check lipids and follow-up.      Relevant Medications   atorvastatin (LIPITOR) 20 MG tablet   Other Relevant Orders   Lipid panel   Personal history of noncompliance with medical treatment, presenting hazards to health    Encouraged her to discuss the fact that she is not taking her blood pressure medication or medications for mood with her PCP.  She seems to be doing well off of the medications.      Precordial pain - Primary    Discussed that her pain does not sound cardiopulmonary.  No concern for ACS, PE or infection.  Reviewed note from PCP and cardiologist regarding history of chest pain. Follow-up in 6 weeks with her PCP or sooner if needed.      Relevant Medications   pantoprazole (PROTONIX) 40 MG tablet   Other Visit Diagnoses     Chest pressure       Relevant Orders   EKG 12-Lead   CBC with Differential/Platelet   TSH       I have discontinued Livian O. Dolloff's rosuvastatin. I am also having her start on atorvastatin. Additionally, I am having her maintain her aspirin EC, amLODipine, buPROPion, DULoxetine, and pantoprazole.  Meds ordered this encounter  Medications   atorvastatin (LIPITOR) 20 MG tablet    Sig: Take 1 tablet (20 mg total) by mouth daily.    Dispense:  90 tablet    Refill:  1    Order Specific Question:   Supervising Provider    Answer:   Pricilla Holm A [id number]]   pantoprazole (PROTONIX) 40 MG tablet    Sig: Take 1 tablet (40 mg total) by  mouth daily.    Dispense:  90 tablet    Refill:  0    Order Specific Question:   Supervising Provider    Answer:   Pricilla Holm A [id number]

## 2022-12-05 NOTE — Progress Notes (Signed)
As we discussed at her visit, her cholesterol is very high. I recommend starting on the statin and following up with Dr. Ronnald Ramp in 6-8 weeks

## 2022-12-05 NOTE — Assessment & Plan Note (Signed)
States she does not take amlodipine and her blood pressure is always fine at her visits.  She does not check her blood pressure at home.  Check BMP.  Follow-up with PCP

## 2022-12-05 NOTE — Assessment & Plan Note (Signed)
Encouraged her to discuss the fact that she is not taking her blood pressure medication or medications for mood with her PCP.  She seems to be doing well off of the medications.

## 2022-12-05 NOTE — Assessment & Plan Note (Signed)
Discussed significant risk of heart disease, heart attack and stroke with LDL greater than 190.  Reportedly had myalgias with rosuvastatin.  Is willing to try different statin.  Atorvastatin prescribed.  Check lipids and follow-up.

## 2022-12-05 NOTE — Patient Instructions (Addendum)
Please go downstairs for labs before you leave today.  I refilled your Protonix for acid reflux and I recommend that you start taking this daily.  Avoid eating and laying down within 2 hours.  Avoid eating foods or drinking beverages that cause acid reflux  Start taking atorvastatin for your cholesterol.    Food Choices for Gastroesophageal Reflux Disease, Adult When you have gastroesophageal reflux disease (GERD), the foods you eat and your eating habits are very important. Choosing the right foods can help ease your discomfort. Think about working with a food expert (dietitian) to help you make good choices. What are tips for following this plan? Reading food labels Look for foods that are low in saturated fat. Foods that may help with your symptoms include: Foods that have less than 5% of daily value (DV) of fat. Foods that have 0 grams of trans fat. Cooking Do not fry your food. Cook your food by baking, steaming, grilling, or broiling. These are all methods that do not need a lot of fat for cooking. To add flavor, try to use herbs that are low in spice and acidity. Meal planning  Choose healthy foods that are low in fat, such as: Fruits and vegetables. Whole grains. Low-fat dairy products. Lean meats, fish, and poultry. Eat small meals often instead of eating 3 large meals each day. Eat your meals slowly in a place where you are relaxed. Avoid bending over or lying down until 2-3 hours after eating. Limit high-fat foods such as fatty meats or fried foods. Limit your intake of fatty foods, such as oils, butter, and shortening. Avoid the following as told by your doctor: Foods that cause symptoms. These may be different for different people. Keep a food diary to keep track of foods that cause symptoms. Alcohol. Drinking a lot of liquid with meals. Eating meals during the 2-3 hours before bed. Lifestyle Stay at a healthy weight. Ask your doctor what weight is healthy for you.  If you need to lose weight, work with your doctor to do so safely. Exercise for at least 30 minutes on 5 or more days each week, or as told by your doctor. Wear loose-fitting clothes. Do not smoke or use any products that contain nicotine or tobacco. If you need help quitting, ask your doctor. Sleep with the head of your bed higher than your feet. Use a wedge under the mattress or blocks under the bed frame to raise the head of the bed. Chew sugar-free gum after meals. What foods should eat?  Eat a healthy, well-balanced diet of fruits, vegetables, whole grains, low-fat dairy products, lean meats, fish, and poultry. Each person is different. Foods that may cause symptoms in one person may not cause any symptoms in another person. Work with your doctor to find foods that are safe for you. The items listed above may not be a complete list of what you can eat and drink. Contact a food expert for more options. What foods should I avoid? Limiting some of these foods may help in managing the symptoms of GERD. Everyone is different. Talk with a food expert or your doctor to help you find the exact foods to avoid, if any. Fruits Any fruits prepared with added fat. Any fruits that cause symptoms. For some people, this may include citrus fruits, such as oranges, grapefruit, pineapple, and lemons. Vegetables Deep-fried vegetables. [place] fries. Any vegetables prepared with added fat. Any vegetables that cause symptoms. For some people, this may include tomatoes and  tomato products, chili peppers, onions and garlic, and horseradish. Grains Pastries or quick breads with added fat. Meats and other proteins High-fat meats, such as fatty beef or pork, hot dogs, ribs, ham, sausage, salami, and bacon. Fried meat or protein, including fried fish and fried chicken. Nuts and nut butters, in large amounts. Dairy Whole milk and chocolate milk. Sour cream. Cream. Ice cream. Cream cheese. Milkshakes. Fats and  oils Butter. Margarine. Shortening. Ghee. Beverages Coffee and tea, with or without caffeine. Carbonated beverages. Sodas. Energy drinks. Fruit juice made with acidic fruits, such as orange or grapefruit. Tomato juice. Alcoholic drinks. Sweets and desserts Chocolate and cocoa. Donuts. Seasonings and condiments Pepper. Peppermint and spearmint. Added salt. Any condiments, herbs, or seasonings that cause symptoms. For some people, this may include curry, hot sauce, or vinegar-based salad dressings. The items listed above may not be a complete list of what you should not eat and drink. Contact a food expert for more options. Questions to ask your doctor Diet and lifestyle changes are often the first steps that are taken to manage symptoms of GERD. If diet and lifestyle changes do not help, talk with your doctor about taking medicines. Where to find more information [employer] G[employer]: aboutgerd.org Summary When you have GERD, food and lifestyle choices are very important in easing your symptoms. Eat small meals often instead of 3 large meals a day. Eat your meals slowly and in a place where you are relaxed. Avoid bending over or lying down until 2-3 hours after eating. Limit high-fat foods such as fatty meats or fried foods. This information is not intended to replace advice given to you by your health care provider. Make sure you discuss any questions you have with your health care provider. Document Revised: 03/27/2020 Document Reviewed: 03/27/2020 Elsevier Patient Education  [place].

## 2022-12-05 NOTE — Assessment & Plan Note (Addendum)
Discussed that her pain does not sound cardiopulmonary.  No concern for ACS, PE or infection.  Reviewed note from PCP and cardiologist regarding history of chest pain. Follow-up in 6 weeks with her PCP or sooner if needed.

## 2022-12-05 NOTE — Assessment & Plan Note (Signed)
EKG with sinus rhythm, first-degree AV block.  Rate 91.  Unchanged from old EKG

## 2023-01-13 ENCOUNTER — Ambulatory Visit: Payer: PPO | Admitting: Internal Medicine

## 2023-01-27 ENCOUNTER — Encounter: Payer: Self-pay | Admitting: Internal Medicine

## 2023-01-27 ENCOUNTER — Ambulatory Visit (INDEPENDENT_AMBULATORY_CARE_PROVIDER_SITE_OTHER): Payer: PPO | Admitting: Internal Medicine

## 2023-01-27 VITALS — BP 126/70 | HR 78 | Temp 97.7°F | Ht 68.0 in | Wt 274.0 lb

## 2023-01-27 DIAGNOSIS — Z0001 Encounter for general adult medical examination with abnormal findings: Secondary | ICD-10-CM

## 2023-01-27 DIAGNOSIS — E7800 Pure hypercholesterolemia, unspecified: Secondary | ICD-10-CM | POA: Diagnosis not present

## 2023-01-27 DIAGNOSIS — Z23 Encounter for immunization: Secondary | ICD-10-CM

## 2023-01-27 DIAGNOSIS — Z6841 Body Mass Index (BMI) 40.0 and over, adult: Secondary | ICD-10-CM | POA: Diagnosis not present

## 2023-01-27 DIAGNOSIS — Z1231 Encounter for screening mammogram for malignant neoplasm of breast: Secondary | ICD-10-CM

## 2023-01-27 MED ORDER — BOOSTRIX 5-2.5-18.5 LF-MCG/0.5 IM SUSP: 0.5000 mL | Freq: Once | INTRAMUSCULAR | 0 refills | Status: AC

## 2023-01-27 MED ORDER — SHINGRIX 50 MCG/0.5ML IM SUSR: 0.5000 mL | Freq: Once | INTRAMUSCULAR | 1 refills | Status: AC

## 2023-01-27 NOTE — Progress Notes (Unsigned)
Subjective:  Patient ID: Alexandra Robinson, female    DOB: 1956/03/08  Age: 67 y.o. MRN: [id number]  CC: Annual Exam, Hypertension, and Hypothyroidism   HPI Alexandra Robinson presents for a CPX and f/up ----  He is not taking any meds and feels well. She walks every day and has good endurance. She denies DOE, CP, diaphoresis, edema.  Outpatient Medications Prior to Visit  Medication Sig Dispense Refill   aspirin EC 81 MG tablet Take 1 tablet (81 mg total) by mouth daily. Swallow whole. 90 tablet 3   amLODipine (NORVASC) 5 MG tablet Take 1 tablet (5 mg total) by mouth daily. 90 tablet 3   atorvastatin (LIPITOR) 20 MG tablet Take 1 tablet (20 mg total) by mouth daily. 90 tablet 1   buPROPion (WELLBUTRIN XL) 150 MG 24 hr tablet Take 1 tablet (150 mg total) by mouth daily. 30 tablet 0   DULoxetine (CYMBALTA) 30 MG capsule Take 1 capsule (30 mg total) by mouth daily. 30 capsule 0   pantoprazole (PROTONIX) 40 MG tablet Take 1 tablet (40 mg total) by mouth daily. 90 tablet 0   No facility-administered medications prior to visit.    ROS Review of Systems  Constitutional: Negative.  Negative for diaphoresis and fatigue.  HENT: Negative.    Eyes: Negative.   Respiratory:  Negative for chest tightness, shortness of breath and wheezing.   Cardiovascular:  Negative for chest pain, palpitations and leg swelling.  Gastrointestinal:  Negative for abdominal pain, constipation, diarrhea, nausea and vomiting.  Endocrine: Negative.   Genitourinary: Negative.  Negative for difficulty urinating.  Musculoskeletal: Negative.   Skin: Negative.   Neurological: Negative.  Negative for dizziness and weakness.  Hematological:  Negative for adenopathy. Does not bruise/bleed easily.  Psychiatric/Behavioral: Negative.      Objective:  BP 126/70 (BP Location: Right Arm, Patient Position: Sitting, Cuff Size: Large)   Pulse 78   Temp 97.7 F (36.5 C) (Oral)   Ht 5\' 8"  (1.727 m)   Wt 274 lb (124.3 kg)    SpO2 95%   BMI 41.66 kg/m   BP Readings from Last 3 Encounters:  01/27/23 126/70  12/05/22 136/84  03/20/22 136/82    Wt Readings from Last 3 Encounters:  01/27/23 274 lb (124.3 kg)  12/05/22 272 lb (123.4 kg)  03/20/22 269 lb (122 kg)    Physical Exam Vitals reviewed.  Constitutional:      Appearance: Normal appearance.  HENT:     Nose: Nose normal.     Mouth/Throat:     Mouth: Mucous membranes are moist.  Eyes:     General: No scleral icterus.    Conjunctiva/sclera: Conjunctivae normal.  Cardiovascular:     Rate and Rhythm: Normal rate and regular rhythm.     Heart sounds: No murmur heard. Pulmonary:     Effort: Pulmonary effort is normal.     Breath sounds: No stridor. No wheezing, rhonchi or rales.  Abdominal:     General: Abdomen is flat.     Palpations: There is no mass.     Tenderness: There is no abdominal tenderness. There is no guarding.     Hernia: No hernia is present.  Musculoskeletal:        General: Normal range of motion.     Cervical back: Neck supple.     Right lower leg: No edema.     Left lower leg: No edema.  Lymphadenopathy:     Cervical: No cervical adenopathy.  Skin:  General: Skin is warm and dry.  Neurological:     General: No focal deficit present.     Mental Status: She is alert. Mental status is at baseline.  Psychiatric:        Mood and Affect: Mood normal.        Behavior: Behavior normal.        Thought Content: Thought content normal.        Judgment: Judgment normal.     Lab Results  Component Value Date   WBC 4.5 12/05/2022   HGB 14.5 12/05/2022   HCT 43.4 12/05/2022   PLT 270.0 12/05/2022   GLUCOSE 97 12/05/2022   CHOL 279 (H) 12/05/2022   TRIG 126.0 12/05/2022   HDL 64.50 12/05/2022   LDLCALC 189 (H) 12/05/2022   ALT 15 12/05/2022   AST 17 12/05/2022   NA 138 12/05/2022   K 4.1 12/05/2022   CL 104 12/05/2022   CREATININE 0.87 12/05/2022   BUN 9 12/05/2022   CO2 29 12/05/2022   TSH 2.57 12/05/2022    HGBA1C 5.9 03/20/2022    No results found.  Assessment & Plan:    Visit for screening mammogram -     Digital Screening Mammogram, Left and Right; Future  Encounter for general adult medical examination with abnormal findings-  Exam completed, labs reviewed, vaccines reviewed, cancer screenings addressed, anticipatory guidance was given.  Class 3 severe obesity due to excess calories with serious comorbidity and body mass index (BMI) of 40.0 to 44.9 in adult [facility])- She is working on her lifestyle modifications.  Hypercholesterolemia with LDL greater than 190 mg/dL- She is not willing to take a statin.  Need for shingles vaccine -     Shingrix; Inject 0.5 mLs into the muscle once for 1 dose.  Dispense: 0.5 mL; Refill: 1  Need for prophylactic vaccination with combined diphtheria-tetanus-pertussis (DTP) vaccine -     Boostrix; Inject 0.5 mLs into the muscle once for 1 dose.  Dispense: 0.5 mL; Refill: 0     Follow-up: Return in about 6 months (around 07/29/2023).  Sanda Linger, MD

## 2023-01-27 NOTE — Patient Instructions (Signed)

## 2023-05-16 ENCOUNTER — Telehealth: Payer: Self-pay | Admitting: Internal Medicine

## 2023-05-16 NOTE — Telephone Encounter (Signed)
Patient scheduled an appointment for 06/19/23 with Dr. Yetta Barre. She would like to know if she can have blood work done before the appointment. Best callback is [phone].

## 2023-06-19 ENCOUNTER — Ambulatory Visit: Payer: PPO | Admitting: Internal Medicine

## 2023-07-17 ENCOUNTER — Ambulatory Visit: Payer: PPO

## 2023-07-17 ENCOUNTER — Ambulatory Visit (INDEPENDENT_AMBULATORY_CARE_PROVIDER_SITE_OTHER): Payer: PPO | Admitting: Internal Medicine

## 2023-07-17 ENCOUNTER — Encounter: Payer: Self-pay | Admitting: Internal Medicine

## 2023-07-17 VITALS — BP 122/64 | HR 68 | Temp 98.1°F | Ht 68.0 in | Wt 277.0 lb

## 2023-07-17 DIAGNOSIS — F5101 Primary insomnia: Secondary | ICD-10-CM | POA: Diagnosis not present

## 2023-07-17 DIAGNOSIS — M5432 Sciatica, left side: Secondary | ICD-10-CM

## 2023-07-17 DIAGNOSIS — R079 Chest pain, unspecified: Secondary | ICD-10-CM | POA: Diagnosis not present

## 2023-07-17 DIAGNOSIS — I10 Essential (primary) hypertension: Secondary | ICD-10-CM

## 2023-07-17 DIAGNOSIS — E7800 Pure hypercholesterolemia, unspecified: Secondary | ICD-10-CM | POA: Diagnosis not present

## 2023-07-17 DIAGNOSIS — R7303 Prediabetes: Secondary | ICD-10-CM

## 2023-07-17 LAB — URINALYSIS, ROUTINE W REFLEX MICROSCOPIC
Bilirubin Urine: NEGATIVE
Hgb urine dipstick: NEGATIVE
Ketones, ur: NEGATIVE
Leukocytes,Ua: NEGATIVE
Nitrite: NEGATIVE
Specific Gravity, Urine: 1.015 (ref 1.000–1.030)
Total Protein, Urine: NEGATIVE
Urine Glucose: NEGATIVE
Urobilinogen, UA: 0.2 (ref 0.0–1.0)
pH: 7 (ref 5.0–8.0)

## 2023-07-17 LAB — BASIC METABOLIC PANEL
BUN: 10 mg/dL (ref 6–23)
CO2: 27 meq/L (ref 19–32)
Calcium: 9.9 mg/dL (ref 8.4–10.5)
Chloride: 102 meq/L (ref 96–112)
Creatinine, Ser: 0.84 mg/dL (ref 0.40–1.20)
GFR: 70.74 mL/min (ref >60.00)
Glucose, Bld: 93 mg/dL (ref 70–99)
Potassium: 3.9 meq/L (ref 3.5–5.1)
Sodium: 137 meq/L (ref 135–145)

## 2023-07-17 LAB — CBC WITH DIFFERENTIAL/PLATELET
Basophils Absolute: 0 10*3/uL (ref 0.0–0.1)
Basophils Relative: 1 % (ref 0.0–3.0)
Eosinophils Absolute: 0.1 10*3/uL (ref 0.0–0.7)
Eosinophils Relative: 2.3 % (ref 0.0–5.0)
HCT: 44.7 % (ref 36.0–46.0)
Hemoglobin: 14.5 g/dL (ref 12.0–15.0)
Lymphocytes Relative: 39.4 % (ref 12.0–46.0)
Lymphs Abs: 2.1 10*3/uL (ref 0.7–4.0)
MCHC: 32.5 g/dL (ref 30.0–36.0)
MCV: 94.3 fL (ref 78.0–100.0)
Monocytes Absolute: 0.5 10*3/uL (ref 0.1–1.0)
Monocytes Relative: 9.8 % (ref 3.0–12.0)
Neutro Abs: 2.5 10*3/uL (ref 1.4–7.7)
Neutrophils Relative %: 47.5 % (ref 43.0–77.0)
Platelets: 274 10*3/uL (ref 150.0–400.0)
RBC: 4.74 Mil/uL (ref 3.87–5.11)
RDW: 13.6 % (ref 11.5–15.5)
WBC: 5.2 10*3/uL (ref 4.0–10.5)

## 2023-07-17 LAB — HEPATIC FUNCTION PANEL
ALT: 16 U/L (ref 0–35)
AST: 19 U/L (ref 0–37)
Albumin: 4.4 g/dL (ref 3.5–5.2)
Alkaline Phosphatase: 50 U/L (ref 39–117)
Bilirubin, Direct: 0 mg/dL (ref 0.0–0.3)
Total Bilirubin: 0.4 mg/dL (ref 0.2–1.2)
Total Protein: 8 g/dL (ref 6.0–8.3)

## 2023-07-17 LAB — LIPID PANEL
Cholesterol: 312 mg/dL — ABNORMAL HIGH (ref 0–200)
HDL: 70.4 mg/dL (ref >39.00)
LDL Cholesterol: 209 mg/dL — ABNORMAL HIGH (ref 0–99)
NonHDL: 241.18
Total CHOL/HDL Ratio: 4
Triglycerides: 163 mg/dL — ABNORMAL HIGH (ref 0.0–149.0)
VLDL: 32.6 mg/dL (ref 0.0–40.0)

## 2023-07-17 LAB — TROPONIN I (HIGH SENSITIVITY): High Sens Troponin I: 3 ng/L (ref 2–17)

## 2023-07-17 LAB — HEMOGLOBIN A1C: Hgb A1c MFr Bld: 6 % (ref 4.6–6.5)

## 2023-07-17 LAB — TSH: TSH: 3.15 u[IU]/mL (ref 0.35–5.50)

## 2023-07-17 MED ORDER — TRAZODONE HCL 50 MG PO TABS: 25.0000 mg | ORAL_TABLET | Freq: Every evening | ORAL | 1 refills | Status: DC | PRN

## 2023-07-17 MED ORDER — GABAPENTIN 100 MG PO CAPS: 100.0000 mg | ORAL_CAPSULE | Freq: Three times a day (TID) | ORAL | 5 refills | Status: DC

## 2023-07-17 NOTE — Progress Notes (Signed)
Patient ID: Alexandra Robinson, female   DOB: 12-02-1955, 67 y.o.   MRN: [id number]        Chief Complaint: follow up chest pain, insomnia, hld, left sciatica, anxietyl , preDM, htn       HPI:  Alexandra Robinson is a 67 y.o. female here with c/o  3 wks onset mild intermittent anterior chest pain, without radiation, Pt denies diaphoresis but has mild increased sob or doe, nausea without vomiting, but no wheezing, orthopnea, PND, increased LE swelling, palpitations, dizziness or syncope.  GasX no help and seems different from previous gerd.  Denies worsening depressive symptoms, suicidal ideation, or panic; has ongoing anxiety and increased insomnia recently with diffuculty getting to sleep.  Also has worsening sciatica on left at night to lie down.  Has not been able to take statins.         Wt Readings from Last 3 Encounters:  07/17/23 277 lb (125.6 kg)  01/27/23 274 lb (124.3 kg)  12/05/22 272 lb (123.4 kg)   BP Readings from Last 3 Encounters:  07/17/23 122/64  01/27/23 126/70  12/05/22 136/84         Past Medical History:  Diagnosis Date   Primary hypertension 07/11/2021   Past Surgical History:  Procedure Laterality Date   APPENDECTOMY     BREAST CYST EXCISION      reports that she has never smoked. She has never used smokeless tobacco. She reports that she does not currently use drugs. She reports that she does not drink alcohol. family history includes Alcoholism in her father; Breast cancer in her mother; Coronary artery disease in her brother; Diabetes in her father; Renal cancer in her brother. Allergies  Allergen Reactions   Crestor [Rosuvastatin] Other (See Comments)    myalgias   Lipitor [Atorvastatin] Other (See Comments)    myalgias   Bactrim [Sulfamethoxazole-Trimethoprim] Hives   Current Outpatient Medications on File Prior to Visit  Medication Sig Dispense Refill   aspirin EC 81 MG tablet Take 1 tablet (81 mg total) by mouth daily. Swallow whole. (Patient not taking:  Reported on 07/17/2023) 90 tablet 3   No current facility-administered medications on file prior to visit.        ROS:  All others reviewed and negative.  Objective        PE:  BP 122/64 (BP Location: Right Arm, Patient Position: Sitting, Cuff Size: Normal)   Pulse 68   Temp 98.1 F (36.7 C) (Oral)   Ht 5\' 8"  (1.727 m)   Wt 277 lb (125.6 kg)   SpO2 98%   BMI 42.12 kg/m                 Constitutional: Pt appears in NAD               HENT: Head: NCAT.                Right Ear: External ear normal.                 Left Ear: External ear normal.                Eyes: . Pupils are equal, round, and reactive to light. Conjunctivae and EOM are normal               Nose: without d/c or deformity               Neck: Neck supple. Gross normal ROM  Cardiovascular: Normal rate and regular rhythm.                 Pulmonary/Chest: Effort normal and breath sounds without rales or wheezing.                Abd:  Soft, NT, ND, + BS, no organomegaly               Neurological: Pt is alert. At baseline orientation, motor grossly intact               Skin: Skin is warm. No rashes, no other new lesions, LE edema - none               Psychiatric: Pt behavior is normal without agitation   Micro: none  Cardiac tracings I have personally interpreted today:  ECG - SR  Pertinent Radiological findings (summarize): none   Lab Results  Component Value Date   WBC 5.2 07/17/2023   HGB 14.5 07/17/2023   HCT 44.7 07/17/2023   PLT 274.0 07/17/2023   GLUCOSE 93 07/17/2023   CHOL 312 (H) 07/17/2023   TRIG 163.0 (H) 07/17/2023   HDL 70.40 07/17/2023   LDLCALC 209 (H) 07/17/2023   ALT 16 07/17/2023   AST 19 07/17/2023   NA 137 07/17/2023   K 3.9 07/17/2023   CL 102 07/17/2023   CREATININE 0.84 07/17/2023   BUN 10 07/17/2023   CO2 27 07/17/2023   TSH 3.15 07/17/2023   HGBA1C 6.0 07/17/2023   Assessment/Plan:  Alexandra Robinson is a 67 y.o. Black or African American [2] female with  has  a past medical history of Primary hypertension (07/11/2021).  Primary hypertension BP Readings from Last 3 Encounters:  07/17/23 122/64  01/27/23 126/70  12/05/22 136/84   Stable, pt to continue medical treatment  - diet,wt control  Prediabetes Lab Results  Component Value Date   HGBA1C 6.0 07/17/2023   Stable, pt to continue current medical treatment  - diet, wt control   Hypercholesterolemia with LDL greater than 190 mg/dL Lab Results  Component Value Date   LDLCALC 209 (H) 07/17/2023   Severe uncontrolled, pt to consider repatha 140 bi weekly but pt for Card CT score first per pt preference  Chest pain Atypical, etiology unclear, for ECG, cxr , lab including troponin, consider stress test referral but declines for now  Left sided sciatica Mild to mod recurring at bedtime - for gabaapentin 300 at bedtime, declines imaging for now  Insomnia Mild to mod, for trazodone at bedtime prn,  to f/u any worsening symptoms or concerns  Followup: Return in about 6 months (around 01/15/2024).  Oliver Barre, MD 07/21/2023 7:34 PM [facility]

## 2023-07-17 NOTE — Patient Instructions (Addendum)
Your EKG was done  today  Please take all new medication as prescribed - the trazodone for sleep, and gabapentin for left sided nerve pain  Please continue all other medications as before, and refills have been done if requested.  Please have the pharmacy call with any other refills you may need.  Please continue your efforts at being more active, low cholesterol diet, and weight control..  Please keep your appointments with your specialists as you may have planned  You will be contacted regarding the referral for: CTA + FFR (heart test for blockages)  Please go to the XRAY Department in the first floor for the x-ray testing  Please go to the LAB at the blood drawing area for the tests to be done  You will be contacted by phone if any changes need to be made immediately.  Otherwise, you will receive a letter about your results with an explanation, but please check with MyChart first.  Please make an Appointment to return in 6 months, or sooner if needed - to Dr Yetta Barre please

## 2023-07-18 ENCOUNTER — Encounter: Payer: Self-pay | Admitting: Internal Medicine

## 2023-07-21 ENCOUNTER — Encounter: Payer: Self-pay | Admitting: Internal Medicine

## 2023-07-21 DIAGNOSIS — R079 Chest pain, unspecified: Secondary | ICD-10-CM | POA: Insufficient documentation

## 2023-07-21 DIAGNOSIS — G4700 Insomnia, unspecified: Secondary | ICD-10-CM | POA: Insufficient documentation

## 2023-07-21 DIAGNOSIS — M5432 Sciatica, left side: Secondary | ICD-10-CM | POA: Insufficient documentation

## 2023-07-21 NOTE — Assessment & Plan Note (Signed)
Atypical, etiology unclear, for ECG, cxr , lab including troponin, consider stress test referral but declines for now

## 2023-07-21 NOTE — Assessment & Plan Note (Addendum)
Lab Results  Component Value Date   LDLCALC 209 (H) 07/17/2023   Severe uncontrolled, pt to consider repatha 140 bi weekly but pt for Card CT score first per pt preference

## 2023-07-21 NOTE — Assessment & Plan Note (Signed)
BP Readings from Last 3 Encounters:  07/17/23 122/64  01/27/23 126/70  12/05/22 136/84   Stable, pt to continue medical treatment  - diet,wt control

## 2023-07-21 NOTE — Assessment & Plan Note (Signed)
Mild to mod recurring at bedtime - for gabaapentin 300 at bedtime, declines imaging for now

## 2023-07-21 NOTE — Assessment & Plan Note (Signed)
Lab Results  Component Value Date   HGBA1C 6.0 07/17/2023   Stable, pt to continue current medical treatment  - diet, wt control

## 2023-07-21 NOTE — Assessment & Plan Note (Signed)
Mild to mod, for trazodone at bedtime prn,  to f/u any worsening symptoms or concerns

## 2023-07-30 ENCOUNTER — Telehealth (HOSPITAL_COMMUNITY): Payer: Self-pay | Admitting: *Deleted

## 2023-07-30 NOTE — Telephone Encounter (Signed)
Reaching out to patient to offer assistance regarding upcoming cardiac imaging study; patient request to reschedule this appt.   Appt r/s.   Johney Frame RN Navigator Cardiac Imaging [facility] [phone] office [phone] cell

## 2023-07-31 ENCOUNTER — Ambulatory Visit (HOSPITAL_COMMUNITY): Payer: PPO

## 2023-08-04 ENCOUNTER — Ambulatory Visit: Payer: PPO | Admitting: Internal Medicine

## 2023-08-07 ENCOUNTER — Telehealth (HOSPITAL_COMMUNITY): Payer: Self-pay | Admitting: Emergency Medicine

## 2023-08-07 DIAGNOSIS — R079 Chest pain, unspecified: Secondary | ICD-10-CM

## 2023-08-07 MED ORDER — METOPROLOL TARTRATE 100 MG PO TABS
100.0000 mg | ORAL_TABLET | Freq: Once | ORAL | 0 refills | Status: DC
Start: 2023-08-07 — End: 2023-09-23

## 2023-08-07 NOTE — Telephone Encounter (Signed)
Reaching out to patient to offer assistance regarding upcoming cardiac imaging study; pt verbalizes understanding of appt date/time, parking situation and where to check in, pre-test NPO status and medications ordered, and verified current allergies; name and call back number provided for further questions should they arise Marchia Bond RN Navigator Cardiac Imaging [facility] [phone] office [phone] cell   '100mg'$  metoprolol tartrate

## 2023-08-11 ENCOUNTER — Encounter: Payer: Self-pay | Admitting: Internal Medicine

## 2023-08-13 ENCOUNTER — Ambulatory Visit (HOSPITAL_COMMUNITY): Payer: PPO

## 2023-09-08 ENCOUNTER — Ambulatory Visit
Admission: EM | Admit: 2023-09-08 | Discharge: 2023-09-08 | Disposition: A | Discharge status: Home / Self Care | Payer: PPO | Attending: Physician Assistant | Admitting: Physician Assistant

## 2023-09-08 ENCOUNTER — Encounter: Payer: Self-pay | Admitting: *Deleted

## 2023-09-08 ENCOUNTER — Other Ambulatory Visit: Payer: Self-pay

## 2023-09-08 DIAGNOSIS — J329 Chronic sinusitis, unspecified: Secondary | ICD-10-CM

## 2023-09-08 DIAGNOSIS — J029 Acute pharyngitis, unspecified: Secondary | ICD-10-CM | POA: Diagnosis not present

## 2023-09-08 DIAGNOSIS — J40 Bronchitis, not specified as acute or chronic: Secondary | ICD-10-CM | POA: Diagnosis not present

## 2023-09-08 MED ORDER — AMOXICILLIN-POT CLAVULANATE 875-125 MG PO TABS: 1.0000 | ORAL_TABLET | Freq: Two times a day (BID) | ORAL | 0 refills | Status: DC

## 2023-09-08 NOTE — ED Provider Notes (Signed)
[facility]    CSN: [id number] Arrival date & time: 09/08/23  0913      History   Chief Complaint Chief Complaint  Patient presents with   Sore Throat    HPI Alexandra Robinson is a 67 y.o. female.   Patient presents today with a weeklong history of URI symptoms that have worsened in the past few days.  She reports initially she had some congestion and cough but this has significantly worsened and she is now experiencing sore throat, sinus pressure, headache, cough, congestion.  Denies any chest pain, shortness of breath, nausea, vomiting.  She has had some loose stools.  Does report household sick contacts with similar symptoms; she is unsure what they had but does not think that it was strep.  She is able to eat and drink despite symptoms but does report significant pain with swallowing.  She has been taking Tylenol without improvement of symptoms.  She denies any recent antibiotics or steroids.  Pain is rated 7 on a 0-10 pain scale, described as sharp, worse with swallowing, no alleviating factors identified.  Denies any swelling of her throat, shortness of breath, muffled voice.    Past Medical History:  Diagnosis Date   Primary hypertension 07/11/2021    Patient Active Problem List   Diagnosis Date Noted   Chest pain 07/21/2023   Insomnia 07/21/2023   Left sided sciatica 07/21/2023   Personal history of noncompliance with medical treatment, presenting hazards to health 12/05/2022   Gastroesophageal reflux disease 12/05/2022   Loud snoring 03/20/2022   Primary hypertension 07/11/2021   Hypercholesterolemia with LDL greater than 190 mg/dL 78/46/9629   Encounter for general adult medical examination with abnormal findings 07/11/2021   Colon cancer screening 07/11/2021   Visit for screening mammogram 07/11/2021   Need for prophylactic vaccination with combined diphtheria-tetanus-pertussis (DTP) vaccine 07/11/2021   Abnormal electrocardiogram 07/11/2021   Class 3  severe obesity due to excess calories with serious comorbidity and body mass index (BMI) of 40.0 to 44.9 in adult [facility]) 07/11/2021   Need for shingles vaccine 07/11/2021   Prediabetes 07/11/2021   Ductal carcinoma in situ (DCIS) of left breast 06/29/2018    Past Surgical History:  Procedure Laterality Date   APPENDECTOMY     BREAST CYST EXCISION      OB History   No obstetric history on file.      Home Medications    Prior to Admission medications   Medication Sig Start Date End Date Taking? Authorizing Provider  amoxicillin-clavulanate (AUGMENTIN) 875-125 MG tablet Take 1 tablet by mouth every 12 (twelve) hours. 09/08/23  Yes Benna Arno, Noberto Retort, PA-C  aspirin EC 81 MG tablet Take 1 tablet (81 mg total) by mouth daily. Swallow whole. Patient not taking: Reported on 07/17/2023 08/13/21   Lennette Bihari, MD  gabapentin (NEURONTIN) 100 MG capsule Take 1 capsule (100 mg total) by mouth 3 (three) times daily. Patient not taking: Reported on 09/08/2023 07/17/23   Corwin Levins, MD  metoprolol tartrate (LOPRESSOR) 100 MG tablet Take 1 tablet (100 mg total) by mouth once for 1 dose. Please take one time dose 100mg  metoprolol tartrate 2 hr prior to cardiac CT for HR control IF HR >55bpm. Patient not taking: Reported on 09/08/2023 08/07/23 08/07/23  Sande Rives, MD  traZODone (DESYREL) 50 MG tablet Take 0.5-1 tablets (25-50 mg total) by mouth at bedtime as needed for sleep. Patient not taking: Reported on 09/08/2023 07/17/23   Corwin Levins, MD  Family History Family History  Problem Relation Age of Onset   Breast cancer Mother        late 84s    Diabetes Father    Alcoholism Father    Coronary artery disease Brother    Renal cancer Brother     Social History Social History   Tobacco Use   Smoking status: Never   Smokeless tobacco: Never  Substance Use Topics   Alcohol use: No   Drug use: Not Currently     Allergies   Crestor [rosuvastatin], Lipitor [atorvastatin],  and Bactrim [sulfamethoxazole-trimethoprim]   Review of Systems Review of Systems  Constitutional:  Positive for activity change and fever. Negative for appetite change and fatigue.  HENT:  Positive for congestion, postnasal drip, sinus pressure and sore throat. Negative for sneezing.   Respiratory:  Positive for cough. Negative for shortness of breath.   Cardiovascular:  Negative for chest pain.  Gastrointestinal:  Positive for diarrhea. Negative for abdominal pain, nausea and vomiting.  Neurological:  Positive for headaches.     Physical Exam Triage Vital Signs ED Triage Vitals  Encounter Vitals Group     BP 09/08/23 0951 (!) 146/88     Systolic BP Percentile --      Diastolic BP Percentile --      Pulse Rate 09/08/23 0951 70     Resp 09/08/23 0951 16     Temp 09/08/23 0951 98.1 F (36.7 C)     Temp Source 09/08/23 0951 Oral     SpO2 09/08/23 0951 96 %     Weight --      Height --      Head Circumference --      Peak Flow --      Pain Score 09/08/23 0944 7     Pain Loc --      Pain Education --      Exclude from Growth Chart --    No data found.  Updated Vital Signs BP (!) 146/88 (BP Location: Right Arm)   Pulse 70   Temp 98.1 F (36.7 C) (Oral)   Resp 16   SpO2 96%   Visual Acuity Right Eye Distance:   Left Eye Distance:   Bilateral Distance:    Right Eye Near:   Left Eye Near:    Bilateral Near:     Physical Exam Vitals reviewed.  Constitutional:      General: She is awake. She is not in acute distress.    Appearance: Normal appearance. She is well-developed. She is not ill-appearing.     Comments: Very pleasant female appears stated age in no acute distress sitting comfortably in exam room  HENT:     Head: Normocephalic and atraumatic.     Right Ear: External ear normal. There is impacted cerumen.     Left Ear: Ear canal and external ear normal. There is impacted cerumen.     Nose:     Right Sinus: Maxillary sinus tenderness and frontal sinus  tenderness present.     Left Sinus: Maxillary sinus tenderness and frontal sinus tenderness present.     Mouth/Throat:     Pharynx: Uvula midline. Posterior oropharyngeal erythema and postnasal drip present. No oropharyngeal exudate.  Cardiovascular:     Rate and Rhythm: Normal rate and regular rhythm.     Heart sounds: Normal heart sounds, S1 normal and S2 normal. No murmur heard. Pulmonary:     Effort: Pulmonary effort is normal.     Breath sounds: Normal  breath sounds. No wheezing, rhonchi or rales.     Comments: Clear to auscultation bilaterally Psychiatric:        Behavior: Behavior is cooperative.      UC Treatments / Results  Labs (all labs ordered are listed, but only abnormal results are displayed) Labs Reviewed - No data to display  EKG   Radiology No results found.  Procedures Procedures (including critical care time)  Medications Ordered in UC Medications - No data to display  Initial Impression / Assessment and Plan / UC Course  I have reviewed the triage vital signs and the nursing notes.  Pertinent labs & imaging results that were available during my care of the patient were reviewed by me and considered in my medical decision making (see chart for details).     Patient is well-appearing, afebrile, nontoxic, nontachycardic.  No indication for viral testing as she has been symptomatic for a week and this would not change our management.  Chest x-ray was deferred as she had no adventitious lung sounds on exam and her oxygen saturation was 96%.  Given prolonged and recent double worsening of symptoms will cover for secondary bacterial infection with Augmentin twice daily for 7 days.  No indication for dose adjustment based on metabolic panel from 07/17/2023 with creatinine of at 0.84 and calculated creatinine clearance of 125.38 mL/min.  She was encouraged to continue over-the-counter medication including Mucinex, Flonase, Tylenol.  She is to rest and drink plenty  of fluid.  We discussed that if her symptoms are not improving within a week or if anything worsens she needs to be seen emergently.  Strict return precautions given.  Excuse note declined.  Final Clinical Impressions(s) / UC Diagnoses   Final diagnoses:  Sinobronchitis  Sore throat     Discharge Instructions      Start Augmentin twice daily for 7 days.  Continue over-the-counter medication including Mucinex, Flonase, Tylenol.  Make sure that you rest and drink plenty of fluid.  If your symptoms are not improving within a few days please be seen again.  If anything worsens and you have high fever, worsening cough, shortness of breath, difficulty swallowing, swelling of your throat, change in your voice you need to be seen immediately.     ED Prescriptions     Medication Sig Dispense Auth. Provider   amoxicillin-clavulanate (AUGMENTIN) 875-125 MG tablet Take 1 tablet by mouth every 12 (twelve) hours. 14 tablet Coti Burd, Noberto Retort, PA-C      PDMP not reviewed this encounter.   Jeani Hawking, PA-C 09/08/23 1007

## 2023-09-08 NOTE — ED Triage Notes (Signed)
Sore throat since last week. Subjective fever. States she has been around her grandchildren who have had similar sx. States throat very painful to swallow

## 2023-09-08 NOTE — Discharge Instructions (Signed)
Start Augmentin twice daily for 7 days.  Continue over-the-counter medication including Mucinex, Flonase, Tylenol.  Make sure that you rest and drink plenty of fluid.  If your symptoms are not improving within a few days please be seen again.  If anything worsens and you have high fever, worsening cough, shortness of breath, difficulty swallowing, swelling of your throat, change in your voice you need to be seen immediately.

## 2023-09-09 ENCOUNTER — Encounter: Payer: Self-pay | Admitting: Emergency Medicine

## 2023-09-09 ENCOUNTER — Ambulatory Visit: Payer: PPO

## 2023-09-09 ENCOUNTER — Ambulatory Visit
Admission: EM | Admit: 2023-09-09 | Discharge: 2023-09-09 | Disposition: A | Discharge status: Home / Self Care | Payer: PPO | Attending: Family Medicine | Admitting: Family Medicine

## 2023-09-09 DIAGNOSIS — H6122 Impacted cerumen, left ear: Secondary | ICD-10-CM | POA: Diagnosis not present

## 2023-09-09 NOTE — ED Triage Notes (Signed)
Pt c/o left ear feeling stopped up x 1 day. Pt did put sweet oil in her ear yesterday and ever since she has a noise in her ear.

## 2023-09-09 NOTE — ED Provider Notes (Signed)
  [facility]   [id number] 09/09/23 Arrival Time: 1109  ASSESSMENT & PLAN:  1. Impacted cerumen of left ear    Feeling better after LEFT ear irrigation. No signs of ear infection. OTC as needed.   Follow-up Information     Etta Grandchild, MD.   Specialty: Internal Medicine Why: If worsening or failing to improve as anticipated. Contact information: [address] [place] [address] [phone]                 Reviewed expectations re: course of current medical issues. Questions answered. Outlined signs and symptoms indicating need for more acute intervention. Patient verbalized understanding. After Visit Summary given.   SUBJECTIVE: History from: patient.  Alexandra Robinson is a 67 y.o. female who c/o left ear feeling stopped up x 1 day. Pt did put sweet oil in her ear yesterday and ever since she has a noise in her ear.  Started on antibiotic here yesterday. Social History   Tobacco Use  Smoking Status Never  Smokeless Tobacco Never     OBJECTIVE:  Vitals:   09/09/23 1235  BP: 129/71  Pulse: 70  Resp: 18  Temp: 98.1 F (36.7 C)  TempSrc: Oral  SpO2: 97%     General appearance: alert; no distress HEENT: with nasal congestion; L cerumen impaction (TM appears normal after flushing) Neck: supple without LAD; trachea midline  Skin: warm and dry Psychological: alert and cooperative; normal mood and affect  Allergies  Allergen Reactions   Crestor [Rosuvastatin] Other (See Comments)    myalgias   Lipitor [Atorvastatin] Other (See Comments)    myalgias   Bactrim [Sulfamethoxazole-Trimethoprim] Hives    Past Medical History:  Diagnosis Date   Primary hypertension 07/11/2021   Family History  Problem Relation Age of Onset   Breast cancer Mother        late 31s    Diabetes Father    Alcoholism Father    Coronary artery disease Brother    Renal cancer Brother    Social History   Socioeconomic History   Marital status:  Widowed    Spouse name: Not on file   Number of children: Not on file   Years of education: Not on file   Highest education level: Not on file  Occupational History   Not on file  Tobacco Use   Smoking status: Never   Smokeless tobacco: Never  Substance and Sexual Activity   Alcohol use: No   Drug use: Not Currently   Sexual activity: Not Currently    Partners: Male  Other Topics Concern   Not on file  Social History Narrative   Not on file   Social Determinants of Health   Financial Resource Strain: Not on file  Food Insecurity: Not on file  Transportation Needs: Not on file  Physical Activity: Not on file  Stress: Not on file  Social Connections: Not on file  Intimate Partner Violence: Not on file             Mardella Layman, MD 09/09/23 1358

## 2023-09-22 ENCOUNTER — Other Ambulatory Visit: Payer: Self-pay

## 2023-09-22 ENCOUNTER — Ambulatory Visit: Payer: PPO

## 2023-09-22 ENCOUNTER — Observation Stay (HOSPITAL_BASED_OUTPATIENT_CLINIC_OR_DEPARTMENT_OTHER)
Admission: EM | Admit: 2023-09-22 | Discharge: 2023-09-23 | Disposition: A | Discharge status: Home / Self Care | Payer: PPO | Attending: Internal Medicine | Admitting: Internal Medicine

## 2023-09-22 ENCOUNTER — Emergency Department (HOSPITAL_BASED_OUTPATIENT_CLINIC_OR_DEPARTMENT_OTHER): Payer: PPO

## 2023-09-22 ENCOUNTER — Encounter (HOSPITAL_BASED_OUTPATIENT_CLINIC_OR_DEPARTMENT_OTHER): Payer: Self-pay | Admitting: Emergency Medicine

## 2023-09-22 DIAGNOSIS — R7989 Other specified abnormal findings of blood chemistry: Secondary | ICD-10-CM | POA: Insufficient documentation

## 2023-09-22 DIAGNOSIS — R079 Chest pain, unspecified: Secondary | ICD-10-CM | POA: Diagnosis not present

## 2023-09-22 DIAGNOSIS — R0789 Other chest pain: Secondary | ICD-10-CM

## 2023-09-22 DIAGNOSIS — R300 Dysuria: Secondary | ICD-10-CM | POA: Diagnosis present

## 2023-09-22 DIAGNOSIS — N390 Urinary tract infection, site not specified: Principal | ICD-10-CM | POA: Diagnosis present

## 2023-09-22 DIAGNOSIS — R0602 Shortness of breath: Secondary | ICD-10-CM | POA: Insufficient documentation

## 2023-09-22 DIAGNOSIS — Z7982 Long term (current) use of aspirin: Secondary | ICD-10-CM | POA: Diagnosis not present

## 2023-09-22 DIAGNOSIS — Z79899 Other long term (current) drug therapy: Secondary | ICD-10-CM | POA: Insufficient documentation

## 2023-09-22 DIAGNOSIS — Z1152 Encounter for screening for COVID-19: Secondary | ICD-10-CM | POA: Insufficient documentation

## 2023-09-22 DIAGNOSIS — I10 Essential (primary) hypertension: Secondary | ICD-10-CM | POA: Diagnosis not present

## 2023-09-22 DIAGNOSIS — R072 Precordial pain: Secondary | ICD-10-CM

## 2023-09-22 DIAGNOSIS — E7800 Pure hypercholesterolemia, unspecified: Secondary | ICD-10-CM

## 2023-09-22 LAB — URINALYSIS, W/ REFLEX TO CULTURE (INFECTION SUSPECTED)
Bilirubin Urine: NEGATIVE
Glucose, UA: NEGATIVE mg/dL
Ketones, ur: NEGATIVE mg/dL
Nitrite: POSITIVE — AB
Protein, ur: >300 mg/dL — AB
RBC / HPF: >50 RBC/hpf (ref 0–5)
Specific Gravity, Urine: 1.016 (ref 1.005–1.030)
WBC, UA: >50 WBC/hpf (ref 0–5)
pH: 6 (ref 5.0–8.0)

## 2023-09-22 LAB — CBC WITH DIFFERENTIAL/PLATELET
Abs Immature Granulocytes: 0.08 10*3/uL — ABNORMAL HIGH (ref 0.00–0.07)
Basophils Absolute: 0 10*3/uL (ref 0.0–0.1)
Basophils Relative: 0 %
Eosinophils Absolute: 0 10*3/uL (ref 0.0–0.5)
Eosinophils Relative: 0 %
HCT: 41.8 % (ref 36.0–46.0)
Hemoglobin: 13.7 g/dL (ref 12.0–15.0)
Immature Granulocytes: 1 %
Lymphocytes Relative: 5 %
Lymphs Abs: 0.6 10*3/uL — ABNORMAL LOW (ref 0.7–4.0)
MCH: 30.3 pg (ref 26.0–34.0)
MCHC: 32.8 g/dL (ref 30.0–36.0)
MCV: 92.5 fL (ref 80.0–100.0)
Monocytes Absolute: 0.4 10*3/uL (ref 0.1–1.0)
Monocytes Relative: 3 %
Neutro Abs: 10.8 10*3/uL — ABNORMAL HIGH (ref 1.7–7.7)
Neutrophils Relative %: 91 %
Platelets: 279 10*3/uL (ref 150–400)
RBC: 4.52 MIL/uL (ref 3.87–5.11)
RDW: 13 % (ref 11.5–15.5)
WBC: 11.9 10*3/uL — ABNORMAL HIGH (ref 4.0–10.5)
nRBC: 0 % (ref 0.0–0.2)

## 2023-09-22 LAB — LIPASE, BLOOD: Lipase: 13 U/L (ref 11–51)

## 2023-09-22 LAB — RESP PANEL BY RT-PCR (RSV, FLU A&B, COVID)  RVPGX2
Influenza A by PCR: NEGATIVE
Influenza B by PCR: NEGATIVE
Resp Syncytial Virus by PCR: NEGATIVE
SARS Coronavirus 2 by RT PCR: NEGATIVE

## 2023-09-22 LAB — TROPONIN I (HIGH SENSITIVITY)
Troponin I (High Sensitivity): 71 ng/L — ABNORMAL HIGH (ref <18)
Troponin I (High Sensitivity): 97 ng/L — ABNORMAL HIGH (ref <18)

## 2023-09-22 LAB — LACTIC ACID, PLASMA
Lactic Acid, Venous: 1.7 mmol/L (ref 0.5–1.9)
Lactic Acid, Venous: 1.3 mmol/L (ref 0.5–1.9)

## 2023-09-22 LAB — COMPREHENSIVE METABOLIC PANEL
ALT: 15 U/L (ref 0–44)
AST: 20 U/L (ref 15–41)
Albumin: 4.3 g/dL (ref 3.5–5.0)
Alkaline Phosphatase: 54 U/L (ref 38–126)
Anion gap: 11 (ref 5–15)
BUN: 13 mg/dL (ref 8–23)
CO2: 25 mmol/L (ref 22–32)
Calcium: 9.7 mg/dL (ref 8.9–10.3)
Chloride: 101 mmol/L (ref 98–111)
Creatinine, Ser: 0.88 mg/dL (ref 0.44–1.00)
GFR, Estimated: >60 mL/min (ref >60)
Glucose, Bld: 111 mg/dL — ABNORMAL HIGH (ref 70–99)
Potassium: 3.6 mmol/L (ref 3.5–5.1)
Sodium: 137 mmol/L (ref 135–145)
Total Bilirubin: 0.8 mg/dL (ref <1.2)
Total Protein: 7.4 g/dL (ref 6.5–8.1)

## 2023-09-22 LAB — BRAIN NATRIURETIC PEPTIDE: B Natriuretic Peptide: 45.6 pg/mL (ref 0.0–100.0)

## 2023-09-22 LAB — D-DIMER, QUANTITATIVE: D-Dimer, Quant: 1.11 ug{FEU}/mL — ABNORMAL HIGH (ref 0.00–0.50)

## 2023-09-22 MED ORDER — SODIUM CHLORIDE 0.9 % IV BOLUS
1000.0000 mL | Freq: Once | INTRAVENOUS | Status: AC
  Administered 2023-09-22: 1000 mL via INTRAVENOUS

## 2023-09-22 MED ORDER — ONDANSETRON HCL 4 MG/2ML IJ SOLN: 4.0000 mg | Freq: Once | INTRAMUSCULAR | Status: DC

## 2023-09-22 MED ORDER — HEPARIN BOLUS VIA INFUSION
4000.0000 [IU] | Freq: Once | INTRAVENOUS | Status: AC
  Administered 2023-09-22: 4000 [IU] via INTRAVENOUS

## 2023-09-22 MED ORDER — HEPARIN (PORCINE) 25000 UT/250ML-% IV SOLN
1950.0000 [IU]/h | INTRAVENOUS | Status: DC
  Administered 2023-09-22: 1400 [IU]/h via INTRAVENOUS
  Administered 2023-09-23: 1700 [IU]/h via INTRAVENOUS
  Filled 2023-09-22 (×2): qty 250

## 2023-09-22 MED ORDER — SODIUM CHLORIDE 0.9 % IV SOLN
1.0000 g | Freq: Once | INTRAVENOUS | Status: AC
  Administered 2023-09-22: 1 g via INTRAVENOUS
  Filled 2023-09-22: qty 10

## 2023-09-22 NOTE — ED Notes (Signed)
ED Provider at bedside. 

## 2023-09-22 NOTE — ED Triage Notes (Signed)
Pt c/o "chills" starting last night. Pt also reports n/v today

## 2023-09-22 NOTE — ED Notes (Signed)
[facility] left with patient, attempted to call report x2 with no answer.

## 2023-09-22 NOTE — Hospital Course (Signed)
Alexandra Robinson is a 67 y.o. female with who is admitted for evaluation of chest pain with elevated troponin and UTI.

## 2023-09-22 NOTE — Progress Notes (Signed)
PHARMACY - ANTICOAGULATION CONSULT NOTE  Pharmacy Consult for Heparin Indication: chest pain/ACS  Allergies  Allergen Reactions   Crestor [Rosuvastatin] Other (See Comments)    myalgias   Lipitor [Atorvastatin] Other (See Comments)    myalgias   Bactrim [Sulfamethoxazole-Trimethoprim] Hives    Patient Measurements: Weight: 123.4 kg (272 lb) Heparin Dosing Weight: 94 kg  Vital Signs: Temp: 99.5 F (37.5 C) (12/23 1900) Temp Source: Oral (12/23 1900) BP: 102/60 (12/23 1900) Pulse Rate: 92 (12/23 1900)  Labs: Recent Labs    09/22/23 1652 09/22/23 1820  HGB 13.7  --   HCT 41.8  --   PLT 279  --   CREATININE 0.88  --   TROPONINIHS 71* 97*    Estimated Creatinine Clearance: 83.5 mL/min (by C-G formula based on SCr of 0.88 mg/dL).   Medical History: Past Medical History:  Diagnosis Date   Primary hypertension 07/11/2021   Assessment: 67 year old female to begin heparin for CP No anti-coag meds PTA  Goal of Therapy:  Heparin level 0.3-0.7 units/ml Monitor platelets by anticoagulation protocol: Yes   Plan:  Heparin 4000 units iv bolus x 1 Heparin drip at 1400 units / hr Heparin level 6 hours after heparin starts Daily heparin level, CBC  Thank you. Okey Regal, PharmD 09/22/2023,9:23 PM

## 2023-09-22 NOTE — H&P (Signed)
History and Physical    Alexandra Robinson KVQ:[id number] DOB: 1956-09-08 DOA: 09/22/2023  PCP: Etta Grandchild, MD  Patient coming from: Home  I have personally briefly reviewed patient's old medical records in [facility]  Chief Complaint: Chills, dysuria  HPI: Alexandra Robinson is a 67 y.o. female who presented to the ED for evaluation of chills and dysuria.  Patient states that she had a sore throat and cough 2 weeks ago.  She was given a prescription of amoxicillin which she completed.  Her sore throat resolved after 1 week.  Cough is improving.  She reports recent urinary frequency and dysuria.  She has noticed cloudy appearance in her urine.  She was concerned she was developing a UTI.  Today she developed shaking chills then nausea with vomiting.  She reported to the ED provider experiencing some chest tightness and shortness of breath but on admission tells me that she has not really had any chest pain.  She has not had any abdominal pain or swelling to her extremities.  ED Course  Labs/Imaging on admission: I have personally reviewed following labs and imaging studies.  Initial vitals showed BP 122/56, pulse 98, RR 18, temp 100 F, SpO2 97% on room air.  Labs showed WBC 11.9, hemoglobin 13.7, platelets 279,000, sodium 137, potassium 3.6, bicarb 25, BUN 13, creatinine 0.88, serum glucose 111, LFTs within normal limits, lactic acid 1.7 > 1.3, lipase 13, BNP 45.6.  Urinalysis showed positive nitrites, large leukocytes, >50 RBCs and WBCs, rare bacteria.  Urine and blood cultures in process.  SARS-CoV-2, influenza, RSV PCR negative.  Troponin 71 > 97.  D-dimer 1.11.  Portable chest x-ray negative for focal consolidation, edema, effusion.  Patient was given 1 L normal saline, IV ceftriaxone.  EDP spoke with on-call cardiology who recommended starting IV heparin and medical admission.  The hospitalist service was consulted to admit.  Review of Systems: All systems reviewed and  are negative except as documented in history of present illness above.   Past Medical History:  Diagnosis Date   Primary hypertension 07/11/2021    Past Surgical History:  Procedure Laterality Date   APPENDECTOMY     BREAST CYST EXCISION      Social History:  reports that she has never smoked. She has never used smokeless tobacco. She reports that she does not currently use drugs. She reports that she does not drink alcohol.  Allergies  Allergen Reactions   Crestor [Rosuvastatin] Other (See Comments)    myalgias   Lipitor [Atorvastatin] Other (See Comments)    myalgias   Bactrim [Sulfamethoxazole-Trimethoprim] Hives    Family History  Problem Relation Age of Onset   Breast cancer Mother        late 79s    Diabetes Father    Alcoholism Father    Coronary artery disease Brother    Renal cancer Brother      Prior to Admission medications   Medication Sig Start Date End Date Taking? Authorizing Provider  amoxicillin-clavulanate (AUGMENTIN) 875-125 MG tablet Take 1 tablet by mouth every 12 (twelve) hours. 09/08/23   Raspet, Noberto Retort, PA-C  aspirin EC 81 MG tablet Take 1 tablet (81 mg total) by mouth daily. Swallow whole. Patient not taking: Reported on 07/17/2023 08/13/21   Lennette Bihari, MD  gabapentin (NEURONTIN) 100 MG capsule Take 1 capsule (100 mg total) by mouth 3 (three) times daily. Patient not taking: Reported on 09/08/2023 07/17/23   Corwin Levins, MD  metoprolol  tartrate (LOPRESSOR) 100 MG tablet Take 1 tablet (100 mg total) by mouth once for 1 dose. Please take one time dose 100mg  metoprolol tartrate 2 hr prior to cardiac CT for HR control IF HR >55bpm. Patient not taking: Reported on 09/08/2023 08/07/23 08/07/23  Sande Rives, MD  traZODone (DESYREL) 50 MG tablet Take 0.5-1 tablets (25-50 mg total) by mouth at bedtime as needed for sleep. Patient not taking: Reported on 09/08/2023 07/17/23   Corwin Levins, MD    Physical Exam: Vitals:   09/22/23 1626  09/22/23 1627 09/22/23 1830 09/22/23 1900  BP: (!) 122/56  (!) 100/59 102/60  Pulse: 100  91 92  Resp: 18  (!) 30 20  Temp:    99.5 F (37.5 C)  TempSrc:    Oral  SpO2: 97%  97% 98%  Weight:  123.4 kg     Constitutional: Resting in bed, NAD, calm, comfortable Eyes: EOMI, lids and conjunctivae normal ENMT: Mucous membranes are moist. Posterior pharynx clear of any exudate or lesions.Normal dentition.  Neck: normal, supple, no masses. Respiratory: clear to auscultation bilaterally, no wheezing, no crackles. Normal respiratory effort. No accessory muscle use.  Cardiovascular: Regular rate and rhythm, no murmurs / rubs / gallops. No extremity edema. 2+ pedal pulses. Abdomen: no tenderness, no masses palpated.  Musculoskeletal: no clubbing / cyanosis. No joint deformity upper and lower extremities. Good ROM, no contractures. Normal muscle tone.  Skin: no rashes, lesions, ulcers. No induration Neurologic: Sensation intact. Strength 5/5 in all 4.  Psychiatric: Normal judgment and insight. Alert and oriented x 3. Normal mood.   EKG: Personally reviewed. Sinus rhythm, rate 96, PVC present.  No acute ischemic changes.  PVC is new when compared to previous.  Assessment/Plan Principal Problem:   Chest pain, rule out acute myocardial infarction Active Problems:   Urinary tract infection   Alexandra Robinson is a 67 y.o. female with who is admitted for evaluation of chest pain with elevated troponin and UTI.  Assessment and Plan: Elevated troponin and D-dimer: Patient reported some chest tightness and shortness of breath to the ED provider per their documentation but on admission states that she has not really had any chest pain.  Troponin 71 > 97.  D-dimer 1.11.  EKG without acute ischemic changes.  EDP spoke with cardiologist on-call who recommended starting on IV heparin drip.  Patient had been scheduled for CT coronary study as an outpatient which has not yet been completed.  Troponin elevation  may be demand ischemia in setting of infection. -Continue IV heparin for now -Repeat troponin level -Obtain CTA chest -Keep on telemetry  Urinary tract infection: Patient with urinary symptoms and UA consistent with UTI. -Continue IV ceftriaxone -Follow urine culture   DVT prophylaxis: IV heparin Code Status: Full code, confirmed with patient on admission Family Communication: Daughter at bedside Disposition Plan: From home and likely discharge to home pending clinical progress Consults called: None Severity of Illness: The appropriate patient status for this patient is OBSERVATION. Observation status is judged to be reasonable and necessary in order to provide the required intensity of service to ensure the patient's safety. The patient's presenting symptoms, physical exam findings, and initial radiographic and laboratory data in the context of their medical condition is felt to place them at decreased risk for further clinical deterioration. Furthermore, it is anticipated that the patient will be medically stable for discharge from the hospital within 2 midnights of admission.   Darreld Mclean MD [facility]  If  7PM-7AM, please contact night-coverage [email or web address]  09/23/2023, 12:09 AM

## 2023-09-22 NOTE — ED Notes (Signed)
Triage delay, pt in restroom 

## 2023-09-22 NOTE — ED Provider Notes (Signed)
[place] EMERGENCY DEPARTMENT AT [facility] Provider Note   CSN: [id number] Arrival date & time: 09/22/23  1549     History  No chief complaint on file.   Alexandra Robinson is a 67 y.o. female.  The history is provided by the patient and medical records. No language interpreter was used.  URI Presenting symptoms: congestion, cough, fatigue and rhinorrhea   Presenting symptoms: no fever   Severity:  Severe Onset quality:  Gradual Timing:  Constant Progression:  Unchanged Chronicity:  New Worsened by:  Nothing Ineffective treatments:  None tried Associated symptoms: no headaches, no neck pain and no wheezing   Risk factors: sick contacts        Home Medications Prior to Admission medications   Medication Sig Start Date End Date Taking? Authorizing Provider  amoxicillin-clavulanate (AUGMENTIN) 875-125 MG tablet Take 1 tablet by mouth every 12 (twelve) hours. 09/08/23   Raspet, Noberto Retort, PA-C  aspirin EC 81 MG tablet Take 1 tablet (81 mg total) by mouth daily. Swallow whole. Patient not taking: Reported on 07/17/2023 08/13/21   Lennette Bihari, MD  gabapentin (NEURONTIN) 100 MG capsule Take 1 capsule (100 mg total) by mouth 3 (three) times daily. Patient not taking: Reported on 09/08/2023 07/17/23   Corwin Levins, MD  metoprolol tartrate (LOPRESSOR) 100 MG tablet Take 1 tablet (100 mg total) by mouth once for 1 dose. Please take one time dose 100mg  metoprolol tartrate 2 hr prior to cardiac CT for HR control IF HR >55bpm. Patient not taking: Reported on 09/08/2023 08/07/23 08/07/23  Sande Rives, MD  traZODone (DESYREL) 50 MG tablet Take 0.5-1 tablets (25-50 mg total) by mouth at bedtime as needed for sleep. Patient not taking: Reported on 09/08/2023 07/17/23   Corwin Levins, MD      Allergies    Crestor [rosuvastatin], Lipitor [atorvastatin], and Bactrim [sulfamethoxazole-trimethoprim]    Review of Systems   Review of Systems  Constitutional:  Positive for  chills and fatigue. Negative for diaphoresis and fever.  HENT:  Positive for congestion and rhinorrhea.   Eyes:  Negative for visual disturbance.  Respiratory:  Positive for cough, chest tightness and shortness of breath. Negative for wheezing.   Cardiovascular:  Negative for chest pain and palpitations.  Gastrointestinal:  Positive for nausea and vomiting. Negative for abdominal pain, constipation and diarrhea.  Genitourinary:  Positive for dysuria.  Musculoskeletal:  Negative for back pain, neck pain and neck stiffness.  Skin:  Negative for rash and wound.  Neurological:  Negative for weakness, light-headedness, numbness and headaches.  Psychiatric/Behavioral:  Negative for agitation.   All other systems reviewed and are negative.   Physical Exam Updated Vital Signs There were no vitals taken for this visit. Physical Exam Vitals and nursing note reviewed.  Constitutional:      General: She is not in acute distress.    Appearance: She is well-developed. She is not ill-appearing, toxic-appearing or diaphoretic.  HENT:     Head: Normocephalic and atraumatic.     Nose: No congestion or rhinorrhea.     Mouth/Throat:     Mouth: Mucous membranes are dry.     Pharynx: No oropharyngeal exudate or posterior oropharyngeal erythema.  Eyes:     Extraocular Movements: Extraocular movements intact.     Conjunctiva/sclera: Conjunctivae normal.     Pupils: Pupils are equal, round, and reactive to light.  Cardiovascular:     Rate and Rhythm: Normal rate and regular rhythm.     Pulses:  Normal pulses.     Heart sounds: No murmur heard. Pulmonary:     Effort: Pulmonary effort is normal. No respiratory distress.     Breath sounds: Normal breath sounds. No wheezing, rhonchi or rales.  Chest:     Chest wall: No tenderness.  Abdominal:     General: Abdomen is flat. There is no distension.     Palpations: Abdomen is soft.     Tenderness: There is no abdominal tenderness. There is no right CVA  tenderness, left CVA tenderness, guarding or rebound.  Musculoskeletal:        General: No swelling or tenderness.     Cervical back: Neck supple. No tenderness.     Right lower leg: No edema.     Left lower leg: No edema.  Skin:    General: Skin is warm and dry.     Capillary Refill: Capillary refill takes less than 2 seconds.     Findings: No erythema.  Neurological:     General: No focal deficit present.     Mental Status: She is alert and oriented to person, place, and time.     Sensory: No sensory deficit.     Motor: No weakness.  Psychiatric:        Mood and Affect: Mood normal.     ED Results / Procedures / Treatments   Labs (all labs ordered are listed, but only abnormal results are displayed) Labs Reviewed  CBC WITH DIFFERENTIAL/PLATELET - Abnormal; Notable for the following components:      Result Value   WBC 11.9 (*)    Neutro Abs 10.8 (*)    Lymphs Abs 0.6 (*)    Abs Immature Granulocytes 0.08 (*)    All other components within normal limits  COMPREHENSIVE METABOLIC PANEL - Abnormal; Notable for the following components:   Glucose, Bld 111 (*)    All other components within normal limits  URINALYSIS, W/ REFLEX TO CULTURE (INFECTION SUSPECTED) - Abnormal; Notable for the following components:   APPearance HAZY (*)    Hgb urine dipstick LARGE (*)    Protein, ur >300 (*)    Nitrite POSITIVE (*)    Leukocytes,Ua LARGE (*)    Bacteria, UA RARE (*)    All other components within normal limits  TROPONIN I (HIGH SENSITIVITY) - Abnormal; Notable for the following components:   Troponin I (High Sensitivity) 71 (*)    All other components within normal limits  TROPONIN I (HIGH SENSITIVITY) - Abnormal; Notable for the following components:   Troponin I (High Sensitivity) 97 (*)    All other components within normal limits  RESP PANEL BY RT-PCR (RSV, FLU A&B, COVID)  RVPGX2  URINE CULTURE  CULTURE, BLOOD (ROUTINE X 2)  CULTURE, BLOOD (ROUTINE X 2)  LACTIC ACID,  PLASMA  LACTIC ACID, PLASMA  LIPASE, BLOOD  BRAIN NATRIURETIC PEPTIDE  D-DIMER, QUANTITATIVE    EKG EKG Interpretation Date/Time:  Monday September 22 2023 16:49:57 EST Ventricular Rate:  96 PR Interval:  196 QRS Duration:  86 QT Interval:  333 QTC Calculation: 421 R Axis:   -10  Text Interpretation: Sinus rhythm Sinus pause Abnormal R-wave progression, early transition when compared to prior, new PVC No STEMI Confirmed by Theda Belfast ([id number]) on 09/22/2023 4:54:31 PM  Radiology DG Chest Portable 1 View Result Date: 09/22/2023 CLINICAL DATA:  Shortness of breath.  Cough and chills EXAM: PORTABLE CHEST 1 VIEW COMPARISON:  07/17/2023 FINDINGS: Midline trachea. Patient rotated minimally right. Normal heart size. No  pleural effusion or pneumothorax. Mild left hemidiaphragm elevation. Clear lungs. IMPRESSION: No acute cardiopulmonary disease. Electronically Signed   By: Jeronimo Greaves M.D.   On: 09/22/2023 17:49    Procedures Procedures    CRITICAL CARE Performed by: Canary Brim Sammuel Blick Total critical care time: 35 minutes Critical care time was exclusive of separately billable procedures and treating other patients. Critical care was necessary to treat or prevent imminent or life-threatening deterioration. Critical care was time spent personally by me on the following activities: development of treatment plan with patient and/or surrogate as well as nursing, discussions with consultants, evaluation of patient's response to treatment, examination of patient, obtaining history from patient or surrogate, ordering and performing treatments and interventions, ordering and review of laboratory studies, ordering and review of radiographic studies, pulse oximetry and re-evaluation of patient's condition.  Medications Ordered in ED Medications  ondansetron (ZOFRAN) injection 4 mg (0 mg Intravenous Hold 09/22/23 1714)  cefTRIAXone (ROCEPHIN) 1 g in sodium chloride 0.9 % 100 mL IVPB (has no  administration in time range)  sodium chloride 0.9 % bolus 1,000 mL ( Intravenous Stopped 09/22/23 1848)    ED Course/ Medical Decision Making/ A&P                                 Medical Decision Making Amount and/or Complexity of Data Reviewed Labs: ordered. Radiology: ordered.  Risk Prescription drug management. Decision regarding hospitalization.    Alexandra Robinson is a 67 y.o. female with a past medical history significant for hypertension, hypercholesterolemia, GERD, previous appendectomy who presents with several days of subjective fevers, chills, shaking, nausea, vomiting, dysuria, productive cough, chest tightness, and shortness of breath.  According to patient, she was having grandchildren who had suspected viral illness but no one has tested positive for COVID or flu RSV by report.  She says that she has had some chills and then today started having worsened nausea and vomiting.  She reports dysuria and think she may have UTI.  She reports that a week and a half ago she had some antibiotics given to her for a URI that has improved before starting several days ago again.  She reports some chest tightness and shortness of breath but is denying any crushing chest pain.  Does not report neurologic deficits or significant headache or neck pain.  She denies abdominal pain but just had the nausea and vomiting.  She is not reporting any constipation or diarrhea at this time.  Reports she has had vertigo in the past but denies any vertiginous symptoms at this time.  On exam, lungs clear.  Chest nontender.  Abdomen nontender.  I do not appreciate a murmur.  Patient had no focal neurologic deficits initially with intact sensation and strength and pulses in extremities.  Symmetric smile.  Clear speech.  Pupils symmetric and reactive with normal extraocular movements.  Patient has dry mucous membranes.  She reports she has not been drinking much water and had all the nausea and vomiting today.   She denies any rashes or denies any medication changes.  Clinical aspect viral URI versus UTI verse pneumonia.  Will get a chest x-ray given her productive cough with subjective fevers and chills, urinalysis without dysuria, and will get viral swab to look for COVID/flu/RSV that has been in the community recently.  Will also check some labs and give her some fluids and nausea medicine.  Will get EKG and troponin  given the chest tightness and shortness of breath.  Anticipate reassessment after workup to determine disposition.  8:43 PM Workup returned and patient does have evidence of urinary tract infection with nitrites, leukocytes, and bacteria.  She also has leukocytosis.  Her lactic acid was normal and her lipase was normal.  Troponin surprisingly was elevated and rising in the setting of her chest tightness and shortness of breath.  Patient may have a mild myocarditis or demand ischemia the setting of this infection.  Due to the rising troponin, will call for admission.  Suspect she will likely need echo.  Will give IV antibiotics and get cultures.  Will admit for further management.   9:01 PM Spoke to cardiology who recommended getting a D-dimer and get a CT PE study if that is positive but if not, they agree with admission regardless.  They recommended starting heparin given the rising troponin and continue to trend.  Will call medicine to admit for further management.        Final Clinical Impression(s) / ED Diagnoses Final diagnoses:  Urinary tract infection without hematuria, site unspecified  Chest tightness  Shortness of breath  Elevated troponin level    Clinical Impression: 1. Urinary tract infection without hematuria, site unspecified   2. Chest tightness   3. Shortness of breath   4. Elevated troponin level     Disposition: Admit  This note was prepared with assistance of Dragon voice recognition software. Occasional wrong-word or sound-a-like substitutions may  have occurred due to the inherent limitations of voice recognition software.       Chimaobi Casebolt, Canary Brim, MD 09/22/23 2121

## 2023-09-22 NOTE — ED Notes (Signed)
Attempted to call floor x1 with no answer.

## 2023-09-23 ENCOUNTER — Observation Stay (HOSPITAL_COMMUNITY): Payer: PPO

## 2023-09-23 DIAGNOSIS — N3000 Acute cystitis without hematuria: Secondary | ICD-10-CM

## 2023-09-23 DIAGNOSIS — E7849 Other hyperlipidemia: Secondary | ICD-10-CM

## 2023-09-23 DIAGNOSIS — I2510 Atherosclerotic heart disease of native coronary artery without angina pectoris: Secondary | ICD-10-CM

## 2023-09-23 DIAGNOSIS — R079 Chest pain, unspecified: Secondary | ICD-10-CM | POA: Diagnosis not present

## 2023-09-23 DIAGNOSIS — N390 Urinary tract infection, site not specified: Secondary | ICD-10-CM | POA: Diagnosis present

## 2023-09-23 DIAGNOSIS — I700 Atherosclerosis of aorta: Secondary | ICD-10-CM

## 2023-09-23 LAB — HEPARIN LEVEL (UNFRACTIONATED)
Heparin Unfractionated: 0.14 [IU]/mL — ABNORMAL LOW (ref 0.30–0.70)
Heparin Unfractionated: 0.24 [IU]/mL — ABNORMAL LOW (ref 0.30–0.70)

## 2023-09-23 LAB — BLOOD CULTURE ID PANEL (REFLEXED) - BCID2

## 2023-09-23 LAB — BASIC METABOLIC PANEL
Anion gap: 8 (ref 5–15)
BUN: 8 mg/dL (ref 8–23)
CO2: 23 mmol/L (ref 22–32)
Calcium: 9.1 mg/dL (ref 8.9–10.3)
Chloride: 105 mmol/L (ref 98–111)
Creatinine, Ser: 0.85 mg/dL (ref 0.44–1.00)
GFR, Estimated: >60 mL/min (ref >60)
Glucose, Bld: 109 mg/dL — ABNORMAL HIGH (ref 70–99)
Potassium: 3.4 mmol/L — ABNORMAL LOW (ref 3.5–5.1)
Sodium: 136 mmol/L (ref 135–145)

## 2023-09-23 LAB — ECHOCARDIOGRAM COMPLETE
AR max vel: 1.85 cm2
AV Area VTI: 2.16 cm2
AV Area mean vel: 1.91 cm2
AV Mean grad: 5 mm[Hg]
AV Peak grad: 9.4 mm[Hg]
Ao pk vel: 1.53 m/s
Area-P 1/2: 3.58 cm2
Height: 67 in
S' Lateral: 3.3 cm
Weight: 4358.4 [oz_av]

## 2023-09-23 LAB — CBC
HCT: 37.7 % (ref 36.0–46.0)
Hemoglobin: 12.4 g/dL (ref 12.0–15.0)
MCH: 30.8 pg (ref 26.0–34.0)
MCHC: 32.9 g/dL (ref 30.0–36.0)
MCV: 93.5 fL (ref 80.0–100.0)
Platelets: 269 10*3/uL (ref 150–400)
RBC: 4.03 MIL/uL (ref 3.87–5.11)
RDW: 13.1 % (ref 11.5–15.5)
WBC: 14 10*3/uL — ABNORMAL HIGH (ref 4.0–10.5)
nRBC: 0 % (ref 0.0–0.2)

## 2023-09-23 LAB — TROPONIN I (HIGH SENSITIVITY): Troponin I (High Sensitivity): 66 ng/L — ABNORMAL HIGH (ref <18)

## 2023-09-23 LAB — HIV ANTIBODY (ROUTINE TESTING W REFLEX): HIV Screen 4th Generation wRfx: NONREACTIVE

## 2023-09-23 MED ORDER — SODIUM CHLORIDE 0.9% FLUSH
3.0000 mL | Freq: Two times a day (BID) | INTRAVENOUS | Status: DC
  Administered 2023-09-23 (×2): 3 mL via INTRAVENOUS

## 2023-09-23 MED ORDER — POTASSIUM CHLORIDE CRYS ER 20 MEQ PO TBCR
40.0000 meq | EXTENDED_RELEASE_TABLET | Freq: Two times a day (BID) | ORAL | Status: DC
  Administered 2023-09-23: 40 meq via ORAL
  Filled 2023-09-23: qty 2

## 2023-09-23 MED ORDER — IOHEXOL 350 MG/ML SOLN
75.0000 mL | Freq: Once | INTRAVENOUS | Status: AC | PRN
  Administered 2023-09-23: 75 mL via INTRAVENOUS

## 2023-09-23 MED ORDER — HEPARIN BOLUS VIA INFUSION
2500.0000 [IU] | Freq: Once | INTRAVENOUS | Status: DC
  Filled 2023-09-23: qty 2500

## 2023-09-23 MED ORDER — CEPHALEXIN 500 MG PO CAPS: 500.0000 mg | ORAL_CAPSULE | Freq: Three times a day (TID) | ORAL | 0 refills | Status: DC

## 2023-09-23 MED ORDER — EZETIMIBE 10 MG PO TABS: 10.0000 mg | ORAL_TABLET | Freq: Every day | ORAL | 11 refills | Status: AC

## 2023-09-23 MED ORDER — HEPARIN BOLUS VIA INFUSION
3000.0000 [IU] | Freq: Once | INTRAVENOUS | Status: AC
Start: 2023-09-23 — End: 2023-09-23
  Administered 2023-09-23: 3000 [IU] via INTRAVENOUS
  Filled 2023-09-23: qty 3000

## 2023-09-23 MED ORDER — ACETAMINOPHEN 650 MG RE SUPP: 650.0000 mg | Freq: Four times a day (QID) | RECTAL | Status: DC | PRN

## 2023-09-23 MED ORDER — ACETAMINOPHEN 500 MG PO TABS
1000.0000 mg | ORAL_TABLET | Freq: Four times a day (QID) | ORAL | Status: DC | PRN
  Administered 2023-09-23: 1000 mg via ORAL
  Filled 2023-09-23: qty 2

## 2023-09-23 MED ORDER — LEVOFLOXACIN 750 MG PO TABS: 750.0000 mg | ORAL_TABLET | Freq: Every day | ORAL | 0 refills | Status: AC

## 2023-09-23 MED ORDER — SODIUM CHLORIDE 0.9 % IV SOLN
1.0000 g | INTRAVENOUS | Status: DC
  Administered 2023-09-23: 1 g via INTRAVENOUS
  Filled 2023-09-23: qty 10

## 2023-09-23 MED ORDER — ONDANSETRON HCL 4 MG/2ML IJ SOLN: 4.0000 mg | Freq: Four times a day (QID) | INTRAMUSCULAR | Status: DC | PRN

## 2023-09-23 MED ORDER — ASPIRIN EC 81 MG PO TBEC: 81.0000 mg | DELAYED_RELEASE_TABLET | Freq: Every day | ORAL | 0 refills | Status: AC

## 2023-09-23 MED ORDER — ONDANSETRON HCL 4 MG PO TABS
4.0000 mg | ORAL_TABLET | Freq: Four times a day (QID) | ORAL | Status: DC | PRN
Start: 2023-09-23 — End: 2023-09-23

## 2023-09-23 MED ORDER — SENNOSIDES-DOCUSATE SODIUM 8.6-50 MG PO TABS: 1.0000 | ORAL_TABLET | Freq: Every evening | ORAL | Status: DC | PRN

## 2023-09-23 NOTE — H&P (Incomplete)
History and Physical    Alexandra Robinson ZOX:[id number] DOB: 1956-01-23 DOA: 09/22/2023  PCP: Etta Grandchild, MD  Patient coming from: Home  I have personally briefly reviewed patient's old medical records in [facility]  Chief Complaint: Chills, dysuria  HPI: Alexandra Robinson is a 67 y.o. female with medical history significant for HTN who presented to the ED for evaluation of chills and dysuria.  Patient states that she had a sore throat and cough 2 weeks ago.  She was given a prescription of amoxicillin which she completed.  Her sore throat resolved after 1 week.  Cough is improving.  She reports recent urinary frequency and dysuria.  She has noticed cloudy appearance in her urine.  She was concerned she was developing a UTI.  Today she developed shaking chills then nausea with vomiting.  She reported to the ED provider experiencing some chest tightness and shortness of breath but on admission tells me that she has not really had any chest pain.  She has not had any abdominal pain or swelling to her extremities.  ED Course  Labs/Imaging on admission: I have personally reviewed following labs and imaging studies.  Initial vitals showed BP 122/56, pulse 98, RR 18, temp 100 F, SpO2 97% on room air.  Labs showed WBC 11.9, hemoglobin 13.7, platelets 279,000, sodium 137, potassium 3.6, bicarb 25, BUN 13, creatinine 0.88, serum glucose 111, LFTs within normal limits, lactic acid 1.7 > 1.3, lipase 13, BNP 45.6.  Urinalysis showed positive nitrites, large leukocytes, >50 RBCs and WBCs, rare bacteria.  Urine and blood cultures in process.  SARS-CoV-2, influenza, RSV PCR negative.  Troponin 71 > 97.  D-dimer 1.11.  Portable chest x-ray negative for focal consolidation, edema, effusion.  Patient was given 1 L normal saline, IV ceftriaxone.  EDP spoke with on-call cardiology who recommended starting IV heparin and medical admission.  The hospitalist service was consulted to  admit.  Review of Systems: All systems reviewed and are negative except as documented in history of present illness above.   Past Medical History:  Diagnosis Date  . Primary hypertension 07/11/2021    Past Surgical History:  Procedure Laterality Date  . APPENDECTOMY    . BREAST CYST EXCISION      Social History:  reports that she has never smoked. She has never used smokeless tobacco. She reports that she does not currently use drugs. She reports that she does not drink alcohol.  Allergies  Allergen Reactions  . Crestor [Rosuvastatin] Other (See Comments)    myalgias  . Lipitor [Atorvastatin] Other (See Comments)    myalgias  . Bactrim [Sulfamethoxazole-Trimethoprim] Hives    Family History  Problem Relation Age of Onset  . Breast cancer Mother        late 2s   . Diabetes Father   . Alcoholism Father   . Coronary artery disease Brother   . Renal cancer Brother      Prior to Admission medications   Medication Sig Start Date End Date Taking? Authorizing Provider  amoxicillin-clavulanate (AUGMENTIN) 875-125 MG tablet Take 1 tablet by mouth every 12 (twelve) hours. 09/08/23   Raspet, Noberto Retort, PA-C  aspirin EC 81 MG tablet Take 1 tablet (81 mg total) by mouth daily. Swallow whole. Patient not taking: Reported on 07/17/2023 08/13/21   Lennette Bihari, MD  gabapentin (NEURONTIN) 100 MG capsule Take 1 capsule (100 mg total) by mouth 3 (three) times daily. Patient not taking: Reported on 09/08/2023 07/17/23  Corwin Levins, MD  metoprolol tartrate (LOPRESSOR) 100 MG tablet Take 1 tablet (100 mg total) by mouth once for 1 dose. Please take one time dose 100mg  metoprolol tartrate 2 hr prior to cardiac CT for HR control IF HR >55bpm. Patient not taking: Reported on 09/08/2023 08/07/23 08/07/23  Sande Rives, MD  traZODone (DESYREL) 50 MG tablet Take 0.5-1 tablets (25-50 mg total) by mouth at bedtime as needed for sleep. Patient not taking: Reported on 09/08/2023 07/17/23    Corwin Levins, MD    Physical Exam: Vitals:   09/22/23 1626 09/22/23 1627 09/22/23 1830 09/22/23 1900  BP: (!) 122/56  (!) 100/59 102/60  Pulse: 100  91 92  Resp: 18  (!) 30 20  Temp:    99.5 F (37.5 C)  TempSrc:    Oral  SpO2: 97%  97% 98%  Weight:  123.4 kg     Constitutional: Resting in bed, NAD, calm, comfortable Eyes: EOMI, lids and conjunctivae normal ENMT: Mucous membranes are moist. Posterior pharynx clear of any exudate or lesions.Normal dentition.  Neck: normal, supple, no masses. Respiratory: clear to auscultation bilaterally, no wheezing, no crackles. Normal respiratory effort. No accessory muscle use.  Cardiovascular: Regular rate and rhythm, no murmurs / rubs / gallops. No extremity edema. 2+ pedal pulses. Abdomen: no tenderness, no masses palpated.  Musculoskeletal: no clubbing / cyanosis. No joint deformity upper and lower extremities. Good ROM, no contractures. Normal muscle tone.  Skin: no rashes, lesions, ulcers. No induration Neurologic: Sensation intact. Strength 5/5 in all 4.  Psychiatric: Normal judgment and insight. Alert and oriented x 3. Normal mood.   EKG: Personally reviewed. Sinus rhythm, rate 96, PVC present.  No acute ischemic changes.  PVC is new when compared to previous.  Assessment/Plan Principal Problem:   Chest pain, rule out acute myocardial infarction   *** Alexandra Robinson is a 67 y.o. female with medical history significant for HTN who is admitted for evaluation of chest pain with elevated troponin and UTI. *** Assessment and Plan: No notes have been filed under this hospital service. Service: Hospitalist  Chest pain with elevated troponin and D-dimer: ***  Urinary tract infection: ***  URI symptoms***  Hypertension: ***    DVT prophylaxis: ***  Code Status: ***  Family Communication: ***  Disposition Plan: ***  Consults called: ***  Severity of Illness: The appropriate patient status for this patient is OBSERVATION.  Observation status is judged to be reasonable and necessary in order to provide the required intensity of service to ensure the patient's safety. The patient's presenting symptoms, physical exam findings, and initial radiographic and laboratory data in the context of their medical condition is felt to place them at decreased risk for further clinical deterioration. Furthermore, it is anticipated that the patient will be medically stable for discharge from the hospital within 2 midnights of admission.   Darreld Mclean MD [facility]  If 7PM-7AM, please contact night-coverage [email or web address]  09/22/2023, 11:42 PM

## 2023-09-23 NOTE — Progress Notes (Signed)
PHARMACY - ANTICOAGULATION CONSULT NOTE  Pharmacy Consult for Heparin Indication: chest pain/ACS  Allergies  Allergen Reactions   Crestor [Rosuvastatin] Other (See Comments)    myalgias   Lipitor [Atorvastatin] Other (See Comments)    myalgias   Bactrim [Sulfamethoxazole-Trimethoprim] Hives    Patient Measurements: Height: 5\' 7"  (170.2 cm) Weight: 123.6 kg (272 lb 6.4 oz) IBW/kg (Calculated) : 61.6 Heparin Dosing Weight: 94 kg  Vital Signs: Temp: 99.8 F (37.7 C) (12/24 0417) Temp Source: Oral (12/24 0417) BP: 110/58 (12/24 0417) Pulse Rate: 84 (12/24 0206)  Labs: Recent Labs    09/22/23 1652 09/22/23 1820 09/23/23 0357 09/23/23 1109  HGB 13.7  --  12.4  --   HCT 41.8  --  37.7  --   PLT 279  --  269  --   HEPARINUNFRC  --   --  0.14* 0.24*  CREATININE 0.88  --  0.85  --   TROPONINIHS 71* 97* 66*  --     Estimated Creatinine Clearance: 85.2 mL/min (by C-G formula based on SCr of 0.85 mg/dL).   Medical History: Past Medical History:  Diagnosis Date   Primary hypertension 07/11/2021   Assessment: 67 year old female to begin heparin for CP, no anti-coag meds PTA.   Heparin level remains subtherapeutic at 0.24, CBC ok this am.  Goal of Therapy:  Heparin level 0.3-0.7 units/ml Monitor platelets by anticoagulation protocol: Yes   Plan:  Rebolus heparin 2500 units x1 Increase heparin to 1950 units/h Repeat heparin level in 8h  Fredonia Highland, PharmD, [place], [facility] Clinical Pharmacist [phone] Please check AMION for all [facility] numbers 09/23/2023

## 2023-09-23 NOTE — Discharge Summary (Signed)
Physician Discharge Summary  Alexandra Robinson IHK:[id number] DOB: [birth year] DOA: 09/22/2023  PCP: Etta Grandchild, MD  Admit date: 09/22/2023 Discharge date: 09/23/2023  Admitted From: Home Disposition: Home  Recommendations for Outpatient Follow-up:  Follow up with PCP in 1-2 weeks Will follow-up on final cultures, blood cultures and urine cultures.  Discharge Condition: Stable CODE STATUS: Full code Diet recommendation: Low-salt diet  Discharge summary: 67 year old patient with history of coronary atherosclerosis, hypertension, hyperlipidemia who was suffering from sore throat and cough for 2 weeks that has improved and recently completed amoxicillin therapy.  She was having urinary frequency and dysuria.  She had noted cloudy urine.  She developed shaking chills then nausea and vomiting and came to the emergency room.  In the emergency room she experienced some chest tightness and shortness of breath.  In the emergency room her blood pressures were stable.  Temperature was 100 F.  Was on room air.  WBC count 11.9.  Troponins were mildly elevated 71-97.  D-dimer was 1.1.  COVID-19 influenza and RSV PCR were negative.  Urine was abnormal.  Patient was admitted with UTI, chest pain rule out acute coronary syndrome.  Acute UTI present on admission: Received Rocephin 1 g IV x 2. After her discharge, blood cultures was noted to be positive for gram-negative rods.  She is also growing urine cultures with gram-negative rods.  Patient had symptomatically improved. Received 2 days of IV antibiotics.  She was prescribed Keflex 500 mg p.o. 3 times daily.  Received IV antibiotics today.  Depending upon final blood cultures, will call higher dose of oral antibiotics tomorrow.  Chest pain: Acute coronary syndrome ruled out.  Mildly elevated troponin in the context of acute infection.  EKG nonischemic.  2D echocardiogram was with normal ejection fraction, no wall motion abnormalities.  CT angiogram  without evidence of pulmonary embolism.  She does have evidence of coronary artery atherosclerosis. Plan for outpatient CT coronaries, she will keep that appointment. Will prescribe aspirin 81 mg daily Patient is intolerance to statin.  LDL is 209.  Prescribed Zetia.  Follow-up in the office for PCSK9i.   Patient was discharged on Keflex 500 mg 3 times daily for 5 days.  Her blood cultures were reported after the discharge.  Will call levofloxacin 750 mg p.o. for 7 days to treat for gram-negative bacteremia from UTI that should be adequate treatment.    Discharge Diagnoses:  Principal Problem:   Chest pain, rule out acute myocardial infarction Active Problems:   Urinary tract infection    Discharge Instructions  Discharge Instructions     Diet - low sodium heart healthy   Complete by: As directed    Increase activity slowly   Complete by: As directed       Allergies as of 09/23/2023       Reactions   Crestor [rosuvastatin] Other (See Comments)   myalgias   Lipitor [atorvastatin] Other (See Comments)   myalgias   Bactrim [sulfamethoxazole-trimethoprim] Hives        Medication List     STOP taking these medications    amoxicillin-clavulanate 875-125 MG tablet Commonly known as: AUGMENTIN   gabapentin 100 MG capsule Commonly known as: NEURONTIN   metoprolol tartrate 100 MG tablet Commonly known as: LOPRESSOR   traZODone 50 MG tablet Commonly known as: DESYREL       TAKE these medications    aspirin EC 81 MG tablet Take 1 tablet (81 mg total) by mouth daily. Swallow whole.   ezetimibe  10 MG tablet Commonly known as: Zetia Take 1 tablet (10 mg total) by mouth daily.   levofloxacin 750 MG tablet Commonly known as: Levaquin Take 1 tablet (750 mg total) by mouth daily for 7 days.        Allergies  Allergen Reactions   Crestor [Rosuvastatin] Other (See Comments)    myalgias   Lipitor [Atorvastatin] Other (See Comments)    myalgias   Bactrim  [Sulfamethoxazole-Trimethoprim] Hives    Consultations: Cardiology   Procedures/Studies: ECHOCARDIOGRAM COMPLETE Result Date: 09/23/2023    ECHOCARDIOGRAM REPORT   Patient Name:   Alexandra Robinson Date of Exam: 09/23/2023 Medical Rec #:  [id number]       Height:       67.0 in Accession #:    [id number]      Weight:       272.4 lb Date of Birth:  Jul 29, 1956       BSA:          2.306 m Patient Age:    67 years        BP:           110/58 mmHg Patient Gender: F               HR:           80 bpm. Exam Location:  Inpatient Procedure: 2D Echo, Cardiac Doppler and Color Doppler Indications:    Chest Pain R07.9  History:        Patient has prior history of Echocardiogram examinations, most                 recent 09/05/2021. Risk Factors:Hypertension.  Sonographer:    Darlys Gales Referring Phys: [id number] Ortencia Askari IMPRESSIONS  1. No wall motion abnormalities seen. Left ventricular ejection fraction, by estimation, is 60 to 65%. The left ventricle has normal function. Left ventricular endocardial border not optimally defined to evaluate regional wall motion. There is mild concentric left ventricular hypertrophy. Left ventricular diastolic parameters were normal.  2. Right ventricular systolic function is normal. The right ventricular size is normal. There is normal pulmonary artery systolic pressure.  3. The mitral valve was not well visualized. No evidence of mitral valve regurgitation. No evidence of mitral stenosis.  4. The aortic valve was not well visualized. Aortic valve regurgitation is not visualized. No aortic stenosis is present.  5. The inferior vena cava is normal in size with greater than 50% respiratory variability, suggesting right atrial pressure of 3 mmHg. Comparison(s): No significant change from prior study. Prior images reviewed side by side. FINDINGS  Left Ventricle: No wall motion abnormalities seen. Left ventricular ejection fraction, by estimation, is 60 to 65%. The left ventricle has  normal function. Left ventricular endocardial border not optimally defined to evaluate regional wall motion. The left ventricular internal cavity size was normal in size. There is mild concentric left ventricular hypertrophy. Left ventricular diastolic parameters were normal. Right Ventricle: The right ventricular size is normal. No increase in right ventricular wall thickness. Right ventricular systolic function is normal. There is normal pulmonary artery systolic pressure. The tricuspid regurgitant velocity is 1.95 m/s, and  with an assumed right atrial pressure of 3 mmHg, the estimated right ventricular systolic pressure is 18.2 mmHg. Left Atrium: Left atrial size was normal in size. Right Atrium: Right atrial size was normal in size. Pericardium: There is no evidence of pericardial effusion. Mitral Valve: The mitral valve was not well visualized. No evidence of mitral valve regurgitation. No  evidence of mitral valve stenosis. Tricuspid Valve: The tricuspid valve is normal in structure. Tricuspid valve regurgitation is not demonstrated. No evidence of tricuspid stenosis. Aortic Valve: The aortic valve was not well visualized. Aortic valve regurgitation is not visualized. No aortic stenosis is present. Aortic valve mean gradient measures 5.0 mmHg. Aortic valve peak gradient measures 9.4 mmHg. Aortic valve area, by VTI measures 2.16 cm. Pulmonic Valve: The pulmonic valve was not well visualized. Pulmonic valve regurgitation is not visualized. No evidence of pulmonic stenosis. Aorta: The aortic root is normal in size and structure. Venous: The inferior vena cava is normal in size with greater than 50% respiratory variability, suggesting right atrial pressure of 3 mmHg. IAS/Shunts: The atrial septum is grossly normal.  LEFT VENTRICLE PLAX 2D LVIDd:         4.60 cm   Diastology LVIDs:         3.30 cm   LV e' medial:    9.03 cm/s LV PW:         1.00 cm   LV E/e' medial:  9.3 LV IVS:        1.10 cm   LV e' lateral:    10.60 cm/s LVOT diam:     1.80 cm   LV E/e' lateral: 7.9 LV SV:         58 LV SV Index:   25 LVOT Area:     2.54 cm  RIGHT VENTRICLE RV S prime:     13.80 cm/s LEFT ATRIUM             Index LA Vol (A2C):   56.9 ml 24.67 ml/m LA Vol (A4C):   51.2 ml 22.20 ml/m LA Biplane Vol: 54.3 ml 23.55 ml/m  AORTIC VALVE AV Area (Vmax):    1.85 cm AV Area (Vmean):   1.91 cm AV Area (VTI):     2.16 cm AV Vmax:           153.00 cm/s AV Vmean:          105.000 cm/s AV VTI:            0.270 m AV Peak Grad:      9.4 mmHg AV Mean Grad:      5.0 mmHg LVOT Vmax:         111.00 cm/s LVOT Vmean:        78.900 cm/s LVOT VTI:          0.229 m LVOT/AV VTI ratio: 0.85  AORTA Ao Root diam: 3.40 cm MITRAL VALVE               TRICUSPID VALVE MV Area (PHT): 3.58 cm    TR Peak grad:   15.2 mmHg MV Decel Time: 212 msec    TR Vmax:        195.00 cm/s MV E velocity: 83.60 cm/s MV A velocity: 66.80 cm/s  SHUNTS MV E/A ratio:  1.25        Systemic VTI:  0.23 m                            Systemic Diam: 1.80 cm Riley Lam MD Electronically signed by Riley Lam MD Signature Date/Time: 09/23/2023/2:02:50 PM    Final    CT Angio Chest Pulmonary Embolism (PE) W or WO Contrast Result Date: 09/23/2023 CLINICAL DATA:  Pulmonary embolism (PE) suspected, low to intermediate prob, positive D-dimer. Shortness of breath, cough EXAM: CT ANGIOGRAPHY CHEST WITH  CONTRAST TECHNIQUE: Multidetector CT imaging of the chest was performed using the standard protocol during bolus administration of intravenous contrast. Multiplanar CT image reconstructions and MIPs were obtained to evaluate the vascular anatomy. RADIATION DOSE REDUCTION: This exam was performed according to the departmental dose-optimization program which includes automated exposure control, adjustment of the mA and/or kV according to patient size and/or use of iterative reconstruction technique. CONTRAST:  75mL OMNIPAQUE IOHEXOL 350 MG/ML SOLN COMPARISON:  Chest x-ray earlier  today FINDINGS: Cardiovascular: No filling defects in the pulmonary arteries to suggest pulmonary emboli. Heart is normal size. Aorta is normal caliber. Coronary artery and aortic atherosclerosis. Mediastinum/Nodes: No mediastinal, hilar, or axillary adenopathy. Trachea and esophagus are unremarkable. Thyroid unremarkable. Lungs/Pleura: Lungs are clear. No focal airspace opacities or suspicious nodules. No effusions. Upper Abdomen: No acute findings Musculoskeletal: Chest wall soft tissues are unremarkable. No acute bony abnormality. Review of the MIP images confirms the above findings. IMPRESSION: 1. No evidence of pulmonary embolus. 2. Coronary artery disease. 3. No acute cardiopulmonary disease. 4. Aortic Atherosclerosis (ICD10-I70.0). Electronically Signed   By: Charlett Nose M.D.   On: 09/23/2023 01:59   DG Chest Portable 1 View Result Date: 09/22/2023 CLINICAL DATA:  Shortness of breath.  Cough and chills EXAM: PORTABLE CHEST 1 VIEW COMPARISON:  07/17/2023 FINDINGS: Midline trachea. Patient rotated minimally right. Normal heart size. No pleural effusion or pneumothorax. Mild left hemidiaphragm elevation. Clear lungs. IMPRESSION: No acute cardiopulmonary disease. Electronically Signed   By: Jeronimo Greaves M.D.   On: 09/22/2023 17:49   (Echo, Carotid, EGD, Colonoscopy, ERCP)    Subjective: Patient seen in the morning rounds.  Her son was at the bedside.  Patient denied any chest pain or shortness of breath.  She has some dry cough remaining.  She was eager to go home.  A temperature of 100.   Discharge Exam: Vitals:   09/23/23 0206 09/23/23 0417  BP: (!) 116/48 (!) 110/58  Pulse: 84   Resp:    Temp: 100 F (37.8 C) 99.8 F (37.7 C)  SpO2: 95% 97%   Vitals:   09/22/23 2324 09/23/23 0200 09/23/23 0206 09/23/23 0417  BP: (!) 117/50  (!) 116/48 (!) 110/58  Pulse: 99  84   Resp: 20     Temp: 99.5 F (37.5 C)  100 F (37.8 C) 99.8 F (37.7 C)  TempSrc: Oral  Oral Oral  SpO2: 97%  95% 97%   Weight:  123.6 kg    Height:  5\' 7"  (1.702 m)      General: Pt is alert, awake, not in acute distress Cardiovascular: RRR, S1/S2 +, no rubs, no gallops Respiratory: CTA bilaterally, no wheezing, no rhonchi Abdominal: Soft, NT, ND, bowel sounds + Extremities: no edema, no cyanosis    The results of significant diagnostics from this hospitalization (including imaging, microbiology, ancillary and laboratory) are listed below for reference.     Microbiology: Recent Results (from the past 240 hours)  Resp panel by RT-PCR (RSV, Flu A&B, Covid) Anterior Nasal Swab     Status: None   Collection Time: 09/22/23  4:52 PM   Specimen: Anterior Nasal Swab  Result Value Ref Range Status   SARS Coronavirus 2 by RT PCR NEGATIVE NEGATIVE Final    Comment: (NOTE) SARS-CoV-2 target nucleic acids are NOT DETECTED.  The SARS-CoV-2 RNA is generally detectable in upper respiratory specimens during the acute phase of infection. The lowest concentration of SARS-CoV-2 viral copies this assay can detect is 138 copies/mL. A negative result  does not preclude SARS-Cov-2 infection and should not be used as the sole basis for treatment or other patient management decisions. A negative result may occur with  improper specimen collection/handling, submission of specimen other than nasopharyngeal swab, presence of viral mutation(s) within the areas targeted by this assay, and inadequate number of viral copies(<138 copies/mL). A negative result must be combined with clinical observations, patient history, and epidemiological information. The expected result is Negative.  Fact Sheet for Patients:  BloggerCourse.com  Fact Sheet for Healthcare Providers:  SeriousBroker.it  This test is no t yet approved or cleared by the [place] FDA and  has been authorized for detection and/or diagnosis of SARS-CoV-2 by FDA under an Emergency Use Authorization (EUA).  This EUA will remain  in effect (meaning this test can be used) for the duration of the COVID-19 declaration under Section 564(b)(1) of the Act, 21 U.S.C.section 360bbb-3(b)(1), unless the authorization is terminated  or revoked sooner.       Influenza A by PCR NEGATIVE NEGATIVE Final   Influenza B by PCR NEGATIVE NEGATIVE Final    Comment: (NOTE) The Xpert Xpress SARS-CoV-2/FLU/RSV plus assay is intended as an aid in the diagnosis of influenza from Nasopharyngeal swab specimens and should not be used as a sole basis for treatment. Nasal washings and aspirates are unacceptable for Xpert Xpress SARS-CoV-2/FLU/RSV testing.  Fact Sheet for Patients: BloggerCourse.com  Fact Sheet for Healthcare Providers: SeriousBroker.it  This test is not yet approved or cleared by the [place] FDA and has been authorized for detection and/or diagnosis of SARS-CoV-2 by FDA under an Emergency Use Authorization (EUA). This EUA will remain in effect (meaning this test can be used) for the duration of the COVID-19 declaration under Section 564(b)(1) of the Act, 21 U.S.C. section 360bbb-3(b)(1), unless the authorization is terminated or revoked.     Resp Syncytial Virus by PCR NEGATIVE NEGATIVE Final    Comment: (NOTE) Fact Sheet for Patients: BloggerCourse.com  Fact Sheet for Healthcare Providers: SeriousBroker.it  This test is not yet approved or cleared by the [place] FDA and has been authorized for detection and/or diagnosis of SARS-CoV-2 by FDA under an Emergency Use Authorization (EUA). This EUA will remain in effect (meaning this test can be used) for the duration of the COVID-19 declaration under Section 564(b)(1) of the Act, 21 U.S.C. section 360bbb-3(b)(1), unless the authorization is terminated or revoked.  Performed at [facility], [address] [place], [place] [address]   Urine Culture     Status: Abnormal (Preliminary result)   Collection Time: 09/22/23  4:52 PM   Specimen: Urine, Clean Catch  Result Value Ref Range Status   Specimen Description   Final    URINE, CLEAN CATCH Performed at [facility], [address] [place], [place] [address]    Special Requests   Final    NONE Performed at [facility], [address], [place], [place] [address]    Culture (A)  Final    >=100,000 COLONIES/mL GRAM NEGATIVE RODS SUSCEPTIBILITIES TO FOLLOW Performed at [facility], [address] [place], [place] [address]    Report Status PENDING  Incomplete  Blood culture (routine x 2)     Status: None (Preliminary result)   Collection Time: 09/22/23  5:05 PM   Specimen: BLOOD  Result Value Ref Range Status   Specimen Description   Final    BLOOD RIGHT ANTECUBITAL Performed at [facility], [address], [place], [place]  [address]    Special Requests   Final    BOTTLES DRAWN AEROBIC AND ANAEROBIC Blood Culture adequate volume Performed at [facility], [address], [place], [place] [address]    Culture  Setup Time   Final    GRAM NEGATIVE RODS ANAEROBIC BOTTLE ONLY Organism ID to follow Performed at [facility], [address], [place], [place] [address]    Culture GRAM NEGATIVE RODS  Final   Report Status PENDING  Incomplete  Blood culture (routine x 2)     Status: None (Preliminary result)   Collection Time: 09/22/23 10:22 PM   Specimen: BLOOD  Result Value Ref Range Status   Specimen Description   Final    BLOOD LEFT ANTECUBITAL Performed at [facility], [address], [place], [place] [address]    Special Requests   Final    BOTTLES DRAWN AEROBIC AND ANAEROBIC Blood Culture adequate volume Performed at [facility], [address] [place], [place] [address]    Culture   Setup Time   Final    GRAM NEGATIVE RODS ANAEROBIC BOTTLE ONLY Performed at [facility], [address], [place], [place] [address]    Culture GRAM NEGATIVE RODS  Final   Report Status PENDING  Incomplete     Labs: BNP (last 3 results) Recent Labs    09/22/23 1652  BNP 45.6   Basic Metabolic Panel: Recent Labs  Lab 09/22/23 1652 09/23/23 0357  NA 137 136  K 3.6 3.4*  CL 101 105  CO2 25 23  GLUCOSE 111* 109*  BUN 13 8  CREATININE 0.88 0.85  CALCIUM 9.7 9.1   Liver Function Tests: Recent Labs  Lab 09/22/23 1652  AST 20  ALT 15  ALKPHOS 54  BILITOT 0.8  PROT 7.4  ALBUMIN 4.3   Recent Labs  Lab 09/22/23 1652  LIPASE 13   No results for input(s): "AMMONIA" in the last 168 hours. CBC: Recent Labs  Lab 09/22/23 1652 09/23/23 0357  WBC 11.9* 14.0*  NEUTROABS 10.8*  --   HGB 13.7 12.4  HCT 41.8 37.7  MCV 92.5 93.5  PLT 279 269   Cardiac Enzymes: No results for input(s): "CKTOTAL", "CKMB", "CKMBINDEX", "TROPONINI" in the last 168 hours. BNP: Invalid input(s): "POCBNP" CBG: No results for input(s): "GLUCAP" in the last 168 hours. D-Dimer Recent Labs    09/22/23 1652  DDIMER 1.11*   Hgb A1c No results for input(s): "HGBA1C" in the last 72 hours. Lipid Profile No results for input(s): "CHOL", "HDL", "LDLCALC", "TRIG", "CHOLHDL", "LDLDIRECT" in the last 72 hours. Thyroid function studies No results for input(s): "TSH", "T4TOTAL", "T3FREE", "THYROIDAB" in the last 72 hours.  Invalid input(s): "FREET3" Anemia work up No results for input(s): "VITAMINB12", "FOLATE", "FERRITIN", "TIBC", "IRON", "RETICCTPCT" in the last 72 hours. Urinalysis    Component Value Date/Time   COLORURINE YELLOW 09/22/2023 1652   APPEARANCEUR HAZY (A) 09/22/2023 1652   LABSPEC 1.016 09/22/2023 1652   PHURINE 6.0 09/22/2023 1652   GLUCOSEU NEGATIVE 09/22/2023 1652   GLUCOSEU NEGATIVE 07/17/2023 1046   HGBUR LARGE (A) 09/22/2023 1652   BILIRUBINUR NEGATIVE  09/22/2023 1652   KETONESUR NEGATIVE 09/22/2023 1652   PROTEINUR >300 (A) 09/22/2023 1652   UROBILINOGEN 0.2 07/17/2023 1046   NITRITE POSITIVE (A) 09/22/2023 1652   LEUKOCYTESUR LARGE (A) 09/22/2023 1652   Sepsis Labs Recent Labs  Lab 09/22/23 1652 09/23/23 0357  WBC 11.9* 14.0*   Microbiology Recent Results (from the past  240 hours)  Resp panel by RT-PCR (RSV, Flu A&B, Covid) Anterior Nasal Swab     Status: None   Collection Time: 09/22/23  4:52 PM   Specimen: Anterior Nasal Swab  Result Value Ref Range Status   SARS Coronavirus 2 by RT PCR NEGATIVE NEGATIVE Final    Comment: (NOTE) SARS-CoV-2 target nucleic acids are NOT DETECTED.  The SARS-CoV-2 RNA is generally detectable in upper respiratory specimens during the acute phase of infection. The lowest concentration of SARS-CoV-2 viral copies this assay can detect is 138 copies/mL. A negative result does not preclude SARS-Cov-2 infection and should not be used as the sole basis for treatment or other patient management decisions. A negative result may occur with  improper specimen collection/handling, submission of specimen other than nasopharyngeal swab, presence of viral mutation(s) within the areas targeted by this assay, and inadequate number of viral copies(<138 copies/mL). A negative result must be combined with clinical observations, patient history, and epidemiological information. The expected result is Negative.  Fact Sheet for Patients:  BloggerCourse.com  Fact Sheet for Healthcare Providers:  SeriousBroker.it  This test is no t yet approved or cleared by the [place] FDA and  has been authorized for detection and/or diagnosis of SARS-CoV-2 by FDA under an Emergency Use Authorization (EUA). This EUA will remain  in effect (meaning this test can be used) for the duration of the COVID-19 declaration under Section 564(b)(1) of the Act, 21 U.S.C.section  360bbb-3(b)(1), unless the authorization is terminated  or revoked sooner.       Influenza A by PCR NEGATIVE NEGATIVE Final   Influenza B by PCR NEGATIVE NEGATIVE Final    Comment: (NOTE) The Xpert Xpress SARS-CoV-2/FLU/RSV plus assay is intended as an aid in the diagnosis of influenza from Nasopharyngeal swab specimens and should not be used as a sole basis for treatment. Nasal washings and aspirates are unacceptable for Xpert Xpress SARS-CoV-2/FLU/RSV testing.  Fact Sheet for Patients: BloggerCourse.com  Fact Sheet for Healthcare Providers: SeriousBroker.it  This test is not yet approved or cleared by the [place] FDA and has been authorized for detection and/or diagnosis of SARS-CoV-2 by FDA under an Emergency Use Authorization (EUA). This EUA will remain in effect (meaning this test can be used) for the duration of the COVID-19 declaration under Section 564(b)(1) of the Act, 21 U.S.C. section 360bbb-3(b)(1), unless the authorization is terminated or revoked.     Resp Syncytial Virus by PCR NEGATIVE NEGATIVE Final    Comment: (NOTE) Fact Sheet for Patients: BloggerCourse.com  Fact Sheet for Healthcare Providers: SeriousBroker.it  This test is not yet approved or cleared by the [place] FDA and has been authorized for detection and/or diagnosis of SARS-CoV-2 by FDA under an Emergency Use Authorization (EUA). This EUA will remain in effect (meaning this test can be used) for the duration of the COVID-19 declaration under Section 564(b)(1) of the Act, 21 U.S.C. section 360bbb-3(b)(1), unless the authorization is terminated or revoked.  Performed at [facility], [address], [place], [place] [address]   Urine Culture     Status: Abnormal (Preliminary result)   Collection Time: 09/22/23  4:52 PM   Specimen: Urine, Clean Catch  Result  Value Ref Range Status   Specimen Description   Final    URINE, CLEAN CATCH Performed at [facility], [address], [place], [place] [address]    Special Requests   Final    NONE Performed at [facility], [address] [place],  [place] [address]    Culture (A)  Final    >=100,000 COLONIES/mL GRAM NEGATIVE RODS SUSCEPTIBILITIES TO FOLLOW Performed at [facility], [address], [place], [place] [address]    Report Status PENDING  Incomplete  Blood culture (routine x 2)     Status: None (Preliminary result)   Collection Time: 09/22/23  5:05 PM   Specimen: BLOOD  Result Value Ref Range Status   Specimen Description   Final    BLOOD RIGHT ANTECUBITAL Performed at [facility], [address] [place], [place] [address]    Special Requests   Final    BOTTLES DRAWN AEROBIC AND ANAEROBIC Blood Culture adequate volume Performed at [facility], [address], [place], [place] [address]    Culture  Setup Time   Final    GRAM NEGATIVE RODS ANAEROBIC BOTTLE ONLY Organism ID to follow Performed at [facility], [address] [place], [place] [address]    Culture GRAM NEGATIVE RODS  Final   Report Status PENDING  Incomplete  Blood culture (routine x 2)     Status: None (Preliminary result)   Collection Time: 09/22/23 10:22 PM   Specimen: BLOOD  Result Value Ref Range Status   Specimen Description   Final    BLOOD LEFT ANTECUBITAL Performed at [facility], [address], [place], [place] [address]    Special Requests   Final    BOTTLES DRAWN AEROBIC AND ANAEROBIC Blood Culture adequate volume Performed at [facility], [address], [place], [place] [address]    Culture  Setup Time   Final    GRAM NEGATIVE RODS ANAEROBIC BOTTLE ONLY Performed at [facility], [address], [place], [place] [address]    Culture GRAM NEGATIVE  RODS  Final   Report Status PENDING  Incomplete     Time coordinating discharge:  35 minutes  SIGNED:   Dorcas Carrow, MD  [facility] 09/23/2023, 4:38 PM

## 2023-09-23 NOTE — Progress Notes (Signed)
PHARMACY - ANTICOAGULATION CONSULT NOTE  Pharmacy Consult for heparin Indication:  r/o ACS  Labs: Recent Labs    09/22/23 1652 09/22/23 1820 09/23/23 0357  HGB 13.7  --  12.4  HCT 41.8  --  37.7  PLT 279  --  269  HEPARINUNFRC  --   --  0.14*  CREATININE 0.88  --  0.85  TROPONINIHS 71* 97* 66*   Assessment: 67yo female subtherapeutic on heparin with initial dosing for elevated trop and D-dimer; no infusion issues or signs of bleeding per RN.  Goal of Therapy:  Heparin level 0.3-0.7 units/ml   Plan:  3000 units heparin bolus. Increase heparin infusion by 3 units/kgABW/hr to 1700 units/hr. Check level in 6 hours.   Vernard Gambles, PharmD, BCPS 09/23/2023 5:16 AM

## 2023-09-23 NOTE — Consult Note (Addendum)
Cardiology Consultation   Patient ID: Alexandra Robinson MRN: [id number]; DOB: December 03, 1955  Admit date: 09/22/2023 Date of Consult: 09/23/2023  PCP:  Etta Grandchild, MD   [facility] Providers Cardiologist:  Nicki Guadalajara, MD        Patient Profile:   Alexandra Robinson is a 67 y.o. female with a hx of HTN, HLD, and breast cancer who is being seen 09/23/2023 for the evaluation of chest pain at the request of Dr. Jerral Ralph.  History of Present Illness:   Alexandra Robinson was evaluated by Dr. Tresa Endo in 2022 for chest pain following an ER visit for the same in which she ruled out with negative enzymes. She was recommended for CT coronary but this was not completed. She has not followed up with our cardiology group.   She was seen in the ER 09/08/23 for sore throat with positive sick contacts. She was treated conservatively and discharged without admission.   She presented to [facility]/22[facility]23 with sore throat and cough x 2 weeks. UA positive for UTI. Troponin found to be elevated at 71 --> 97 with a D-dimer 1.11.  Lactic acid 1.7 --> 1.3 BNP 45.6  CTA negative for PE and dissection, but does show coronary artery disease and aortic atherosclerosis.   Cardiology consulted for elevated troponin.   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 She can complete 4.0 METS at home (stairs in her house) without angina, no recent chest pain or SOB. She suspected she was getting a UTI and tried to treat by increasing PO fluids at home. Yesterday, she developed chills and shaking that were briefly associated with SOB. She has had no further SOB and denies chest pain.   She remembers presenting for CT coronary and HR was not acceptable to run the study. HR on monitor during my exam was 78 without BB on board.  Past Medical History:  Diagnosis Date   Primary hypertension 07/11/2021    Past Surgical History:  Procedure Laterality Date   APPENDECTOMY     BREAST  CYST EXCISION       Home Medications:  Prior to Admission medications   Medication Sig Start Date End Date Taking? Authorizing Provider  amoxicillin-clavulanate (AUGMENTIN) 875-125 MG tablet Take 1 tablet by mouth every 12 (twelve) hours. Patient not taking: Reported on 09/23/2023 09/08/23   Raspet, Noberto Retort, PA-C  aspirin EC 81 MG tablet Take 1 tablet (81 mg total) by mouth daily. Swallow whole. Patient not taking: Reported on 07/17/2023 08/13/21   Lennette Bihari, MD  gabapentin (NEURONTIN) 100 MG capsule Take 1 capsule (100 mg total) by mouth 3 (three) times daily. Patient not taking: Reported on 09/08/2023 07/17/23   Corwin Levins, MD  metoprolol tartrate (LOPRESSOR) 100 MG tablet Take 1 tablet (100 mg total) by mouth once for 1 dose. Please take one time dose 100mg  metoprolol tartrate 2 hr prior to cardiac CT for HR control IF HR >55bpm. Patient not taking: Reported on 09/08/2023 08/07/23 08/07/23  Sande Rives, MD  traZODone (DESYREL) 50 MG tablet Take 0.5-1 tablets (25-50 mg total) by mouth at bedtime as needed for sleep. Patient not taking: Reported on 09/08/2023 07/17/23   Corwin Levins, MD    Inpatient Medications: Scheduled Meds:  potassium chloride  40 mEq Oral BID   sodium chloride flush  3 mL Intravenous Q12H   Continuous Infusions:  cefTRIAXone (ROCEPHIN)  IV 1 g (09/23/23 1011)  heparin 1,700 Units/hr (09/23/23 1021)   PRN Meds: acetaminophen **OR** acetaminophen, ondansetron **OR** ondansetron (ZOFRAN) IV, senna-docusate  Allergies:    Allergies  Allergen Reactions   Crestor [Rosuvastatin] Other (See Comments)    myalgias   Lipitor [Atorvastatin] Other (See Comments)    myalgias   Bactrim [Sulfamethoxazole-Trimethoprim] Hives    Social History:   Social History   Socioeconomic History   Marital status: Widowed    Spouse name: Not on file   Number of children: Not on file   Years of education: Not on file   Highest education level: Not on file   Occupational History   Not on file  Tobacco Use   Smoking status: Never   Smokeless tobacco: Never  Substance and Sexual Activity   Alcohol use: No   Drug use: Not Currently   Sexual activity: Not Currently    Partners: Male  Other Topics Concern   Not on file  Social History Narrative   Not on file   Social Drivers of Health   Financial Resource Strain: Not on file  Food Insecurity: No Food Insecurity (09/23/2023)   Hunger Vital Sign    Worried About Running Out of Food in the Last Year: Never true    Ran Out of Food in the Last Year: Never true  Transportation Needs: No Transportation Needs (09/23/2023)   PRAPARE - Administrator, Civil Service (Medical): No    Lack of Transportation (Non-Medical): No  Physical Activity: Not on file  Stress: Not on file  Social Connections: Not on file  Intimate Partner Violence: Not At Risk (09/23/2023)   Humiliation, Afraid, Rape, and Kick questionnaire    Fear of Current or Ex-Partner: No    Emotionally Abused: No    Physically Abused: No    Sexually Abused: No    Family History:    Family History  Problem Relation Age of Onset   Breast cancer Mother        late 53s    Diabetes Father    Alcoholism Father    Coronary artery disease Brother    Renal cancer Brother      ROS:  Please see the history of present illness.   All other ROS reviewed and negative.     Physical Exam/Data:   Vitals:   09/22/23 2324 09/23/23 0200 09/23/23 0206 09/23/23 0417  BP: (!) 117/50  (!) 116/48 (!) 110/58  Pulse: 99  84   Resp: 20     Temp: 99.5 F (37.5 C)  100 F (37.8 C) 99.8 F (37.7 C)  TempSrc: Oral  Oral Oral  SpO2: 97%  95% 97%  Weight:  123.6 kg    Height:  5\' 7"  (1.702 m)      Intake/Output Summary (Last 24 hours) at 09/23/2023 1153 Last data filed at 09/23/2023 0139 Gross per 24 hour  Intake 1176.18 ml  Output --  Net 1176.18 ml      09/23/2023    2:00 AM 09/22/2023    4:27 PM 07/17/2023    9:39  AM  Last 3 Weights  Weight (lbs) 272 lb 6.4 oz 272 lb 277 lb  Weight (kg) 123.56 kg 123.378 kg 125.646 kg     Body mass index is 42.66 kg/m.  General:  Well nourished, well developed, in no acute distress HEENT: normal Neck: no JVD Vascular: No carotid bruits; Distal pulses 2+ bilaterally Cardiac:  normal S1, S2; RRR; no murmur  Lungs:  clear to auscultation bilaterally,  no wheezing, rhonchi or rales  Abd: soft, nontender, no hepatomegaly  Ext: no edema Musculoskeletal:  No deformities, BUE and BLE strength normal and equal Skin: warm and dry  Neuro:  CNs 2-12 intact, no focal abnormalities noted Psych:  Normal affect   EKG:  The EKG was personally reviewed and demonstrates:  SR HR 96, PVC Telemetry:  Telemetry was personally reviewed and demonstrates:  sinus rhythm 70-80s with PVCs  Relevant CV Studies:  Echo pending   Laboratory Data:  High Sensitivity Troponin:   Recent Labs  Lab 09/22/23 1652 09/22/23 1820 09/23/23 0357  TROPONINIHS 71* 97* 66*     Chemistry Recent Labs  Lab 09/22/23 1652 09/23/23 0357  NA 137 136  K 3.6 3.4*  CL 101 105  CO2 25 23  GLUCOSE 111* 109*  BUN 13 8  CREATININE 0.88 0.85  CALCIUM 9.7 9.1  GFRNONAA >60 >60  ANIONGAP 11 8    Recent Labs  Lab 09/22/23 1652  PROT 7.4  ALBUMIN 4.3  AST 20  ALT 15  ALKPHOS 54  BILITOT 0.8   Lipids No results for input(s): "CHOL", "TRIG", "HDL", "LABVLDL", "LDLCALC", "CHOLHDL" in the last 168 hours.  Hematology Recent Labs  Lab 09/22/23 1652 09/23/23 0357  WBC 11.9* 14.0*  RBC 4.52 4.03  HGB 13.7 12.4  HCT 41.8 37.7  MCV 92.5 93.5  MCH 30.3 30.8  MCHC 32.8 32.9  RDW 13.0 13.1  PLT 279 269   Thyroid No results for input(s): "TSH", "FREET4" in the last 168 hours.  BNP Recent Labs  Lab 09/22/23 1652  BNP 45.6    DDimer  Recent Labs  Lab 09/22/23 1652  DDIMER 1.11*     Radiology/Studies:  CT Angio Chest Pulmonary Embolism (PE) W or WO Contrast Result Date:  09/23/2023 CLINICAL DATA:  Pulmonary embolism (PE) suspected, low to intermediate prob, positive D-dimer. Shortness of breath, cough EXAM: CT ANGIOGRAPHY CHEST WITH CONTRAST TECHNIQUE: Multidetector CT imaging of the chest was performed using the standard protocol during bolus administration of intravenous contrast. Multiplanar CT image reconstructions and MIPs were obtained to evaluate the vascular anatomy. RADIATION DOSE REDUCTION: This exam was performed according to the departmental dose-optimization program which includes automated exposure control, adjustment of the mA and/or kV according to patient size and/or use of iterative reconstruction technique. CONTRAST:  75mL OMNIPAQUE IOHEXOL 350 MG/ML SOLN COMPARISON:  Chest x-ray earlier today FINDINGS: Cardiovascular: No filling defects in the pulmonary arteries to suggest pulmonary emboli. Heart is normal size. Aorta is normal caliber. Coronary artery and aortic atherosclerosis. Mediastinum/Nodes: No mediastinal, hilar, or axillary adenopathy. Trachea and esophagus are unremarkable. Thyroid unremarkable. Lungs/Pleura: Lungs are clear. No focal airspace opacities or suspicious nodules. No effusions. Upper Abdomen: No acute findings Musculoskeletal: Chest wall soft tissues are unremarkable. No acute bony abnormality. Review of the MIP images confirms the above findings. IMPRESSION: 1. No evidence of pulmonary embolus. 2. Coronary artery disease. 3. No acute cardiopulmonary disease. 4. Aortic Atherosclerosis (ICD10-I70.0). Electronically Signed   By: Charlett Nose M.D.   On: 09/23/2023 01:59   DG Chest Portable 1 View Result Date: 09/22/2023 CLINICAL DATA:  Shortness of breath.  Cough and chills EXAM: PORTABLE CHEST 1 VIEW COMPARISON:  07/17/2023 FINDINGS: Midline trachea. Patient rotated minimally right. Normal heart size. No pleural effusion or pneumothorax. Mild left hemidiaphragm elevation. Clear lungs. IMPRESSION: No acute cardiopulmonary disease.  Electronically Signed   By: Jeronimo Greaves M.D.   On: 09/22/2023 17:49     Assessment and Plan:  Elevated troponin - hs troponin 71 --> 97 - EKG does not appear ischemia - suspect demand ischemia in the setting of a UTI - she denies chest pain and can complete 4.0 METS, works full time job - if echo largely unremarkable, would defer additional ischemic evaluation to OP setting after UTI is treated   Coronary calcification  Aortic atherosclerosis - seen in CTA this admission - will treat UTI and consider OP CT coronary   Hypertension - on no home meds - BP has been normotensive here - unclear chronicity   Hyperlipidemia with LDL goal < 70 07/17/2023: Cholesterol 312; HDL 70.40; LDL Cholesterol 209; Triglycerides 163.0; VLDL 32.6 - history of intolerance to crestor and lipitor - will trial zetia - likely will need PCSK9i    Risk Assessment/Risk Scores:         For questions or updates, please contact [facility] Please consult [email or web address] for contact info under    Signed, Marcelino Duster, PA  09/23/2023 11:53 AM    Patient seen and examined. Agree with assessment and plan.  Aleyah Salom is a 67 year old female who I had seen in 2022 chest pain following an ER evaluation her enzymes were negative.  She was supposed to undergo coronary CTA and an initial attempt was canceled secondary to increased heart rate.  She denies any exertional chest pain symptomatology but she admits that she does not routinely exercise.  She works as a Neurosurgeon.  She had developed recent symptoms of UTI and yesterday developed shaking chills associated with mild shortness of breath.  Upon arrival to the hospital, she had mild troponin elevation with D-dimer increased at 1.11.  CT of her chest was negative for PE and both coronary as well as aortic calcifications were noted.  She has been on antibiotic therapy for her urinary symptoms.  ECG is unremarkable and shows sinus rhythm with  an isolated PVC.  On exam, she is in no acute distress and currently chest pain-free.  HEENT is unremarkable.  She has thick neck.  There is no JVD.  Lungs were clear.  There was no chest wall tenderness to deep palpation.  Them is regular without ectopy.  Abdomen was soft nontender.  Bowel sounds are positive.  There was no clubbing cyanosis or edema.  Neurologic exam was grossly nonfocal.  Telemetry currently reveals sinus rhythm at 81, currently no ectopy.  Laboratories notable for troponin increase 71 >97 > 66.  I have reviewed the echo Doppler study which was just completed which reveals normal LV function with EF 60 to 65% with mild concentric LVH without definitive wall motion abnormalities.  Lipid studies are suggestive of familial hyperlipidemia with total cholesterol 312 and LDL cholesterol at 209.  Apparently she has been intolerant to atorvastatin and rosuvastatin.  At present would initiate Zetia 10 mg but patient will need initiation of PCSK9 inhibition as outpatient.  From a cardiac standpoint, okay to discharge patient today with cardiology follow-up evaluation in several weeks.  Need to continue antibiotic therapy for UTI as outpatient.  Patient should be scheduled for coronary CTA as outpatient for further assessment of coronary calcification noted on CT imaging.   Lennette Bihari, MD, [facility] 09/23/2023 2:18 PM

## 2023-09-24 LAB — URINE CULTURE: Culture: >=100000 — AB

## 2023-09-25 ENCOUNTER — Other Ambulatory Visit (HOSPITAL_COMMUNITY): Payer: Self-pay

## 2023-09-25 LAB — CULTURE, BLOOD (ROUTINE X 2): Special Requests: ADEQUATE

## 2023-09-26 LAB — CULTURE, BLOOD (ROUTINE X 2): Special Requests: ADEQUATE

## 2023-10-13 NOTE — Progress Notes (Deleted)
Cardiology Office Note:    Date:  10/13/2023   ID:  Alexandra Robinson, DOB 1956/03/16, MRN [id number]  PCP:  Etta Grandchild, MD   [facility] Providers Cardiologist:  Nicki Guadalajara, MD { Click to update primary MD,subspecialty MD or APP then REFRESH:1}    Referring MD: Etta Grandchild, MD   No chief complaint on file. ***  History of Present Illness:    Alexandra Robinson is a 68 y.o. female with a hx of HTN, HLD, and breast cancer. She was intiially evaluated by Dr. Tresa Endo for chest pain following an ER visit when she ruled out with negative enzymes.  She was recommended for coronary CTA but this was not yet completed, possibly due to unacceptable heart rate when she presented.  She has not followed up with our cardiology group.  She was seen back in the ER 09/08/2023 with sore throat and positive sick contacts.  She was treated conservatively and discharged without admission.  She presented back to [facility] ED 09/22/2023 with sore throat and cough x 2 weeks and a UA positive for UTI.  Her troponin was found minimally elevated at 71 and 97 and a D-dimer 1.11.  She had a lactic acidosis of 1.7 which improved to 1.3 and her BNP was 45.6.  CTA was negative for PE and dissection but does show coronary artery disease and aortic atherosclerosis.  Cardiology was consulted for elevated troponin. 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   Initial plan was for OP ischemic evaluation if echo largely unremarkable. Echocardiogram showed normal BiV function and no significant valvular disease.  She presents today for cardiology follow-up.     CAD Aortic atherosclerosis - Mild and flat troponin elevation in the ER in the setting of UTI -Prior CT scan with CAD and aortic atherosclerosis - Given risk factors including LDL greater than 200, will proceed with coronary CTA with 100 mg Lopressor    Hyperlipidemia with LDL goal <  70 07/17/2023: Cholesterol 312; HDL 70.40; LDL Cholesterol 209; Triglycerides 163.0; VLDL 32.6 LP(a) not drawn - intolerant to crestor and lipitor - started zetia - will likely need PCSK9i     Past Medical History:  Diagnosis Date   Primary hypertension 07/11/2021    Past Surgical History:  Procedure Laterality Date   APPENDECTOMY     BREAST CYST EXCISION      Current Medications: No outpatient medications have been marked as taking for the 10/16/23 encounter (Appointment) with Marcelino Duster, PA.     Allergies:   Crestor [rosuvastatin], Lipitor [atorvastatin], and Bactrim [sulfamethoxazole-trimethoprim]   Social History   Socioeconomic History   Marital status: Widowed    Spouse name: Not on file   Number of children: Not on file   Years of education: Not on file   Highest education level: Not on file  Occupational History   Not on file  Tobacco Use   Smoking status: Never   Smokeless tobacco: Never  Substance and Sexual Activity   Alcohol use: No   Drug use: Not Currently   Sexual activity: Not Currently    Partners: Male  Other Topics Concern   Not on file  Social History Narrative   Not on file   Social Drivers of Health   Financial Resource Strain: Not on file  Food Insecurity: No Food Insecurity (09/23/2023)   Hunger Vital Sign    Worried About  Running Out of Food in the Last Year: Never true    Ran Out of Food in the Last Year: Never true  Transportation Needs: No Transportation Needs (09/23/2023)   PRAPARE - Administrator, Civil Service (Medical): No    Lack of Transportation (Non-Medical): No  Physical Activity: Not on file  Stress: Not on file  Social Connections: Not on file     Family History: The patient's ***family history includes Alcoholism in her father; Breast cancer in her mother; Coronary artery disease in her brother; Diabetes in her father; Renal cancer in her brother.  ROS:   Please see the history of present  illness.    *** All other systems reviewed and are negative.  EKGs/Labs/Other Studies Reviewed:    The following studies were reviewed today: ***      Recent Labs: 07/17/2023: TSH 3.15 09/22/2023: ALT 15; B Natriuretic Peptide 45.6 09/23/2023: BUN 8; Creatinine, Ser 0.85; Hemoglobin 12.4; Platelets 269; Potassium 3.4; Sodium 136  Recent Lipid Panel    Component Value Date/Time   CHOL 312 (H) 07/17/2023 1046   CHOL 288 (H) 10/09/2021 0911   TRIG 163.0 (H) 07/17/2023 1046   HDL 70.40 07/17/2023 1046   HDL 64 10/09/2021 0911   CHOLHDL 4 07/17/2023 1046   VLDL 32.6 07/17/2023 1046   LDLCALC 209 (H) 07/17/2023 1046   LDLCALC 197 (H) 10/09/2021 0911     Risk Assessment/Calculations:   {Does this patient have ATRIAL FIBRILLATION?:[id number]}  No BP recorded.  {Refresh Note OR Click here to enter BP  :1}***         Physical Exam:    VS:  There were no vitals taken for this visit.    Wt Readings from Last 3 Encounters:  09/23/23 272 lb 6.4 oz (123.6 kg)  07/17/23 277 lb (125.6 kg)  01/27/23 274 lb (124.3 kg)     GEN: *** Well nourished, well developed in no acute distress HEENT: Normal NECK: No JVD; No carotid bruits LYMPHATICS: No lymphadenopathy CARDIAC: ***RRR, no murmurs, rubs, gallops RESPIRATORY:  Clear to auscultation without rales, wheezing or rhonchi  ABDOMEN: Soft, non-tender, non-distended MUSCULOSKELETAL:  No edema; No deformity  SKIN: Warm and dry NEUROLOGIC:  Alert and oriented x 3 PSYCHIATRIC:  Normal affect   ASSESSMENT:    No diagnosis found. PLAN:    In order of problems listed above:  ***      {Are you ordering a CV Procedure (e.g. stress test, cath, DCCV, TEE, etc)?   Press F2        :[id number]}    Medication Adjustments/Labs and Tests Ordered: Current medicines are reviewed at length with the patient today.  Concerns regarding medicines are outlined above.  No orders of the defined types were placed in this encounter.  No orders  of the defined types were placed in this encounter.   There are no Patient Instructions on file for this visit.   Signed, Marcelino Duster, [place]  10/13/2023 4:39 PM    [facility]

## 2023-10-16 ENCOUNTER — Ambulatory Visit: Payer: PPO | Admitting: Physician Assistant

## 2023-11-09 IMAGING — DX DG CHEST 2V
2 series · 2 of 2 positions shown · non-contrast
Comparison: Chest x-ray 08/03/2018

CLINICAL DATA: Chest pain

EXAM:
CHEST - 2 VIEW

[chest pa]
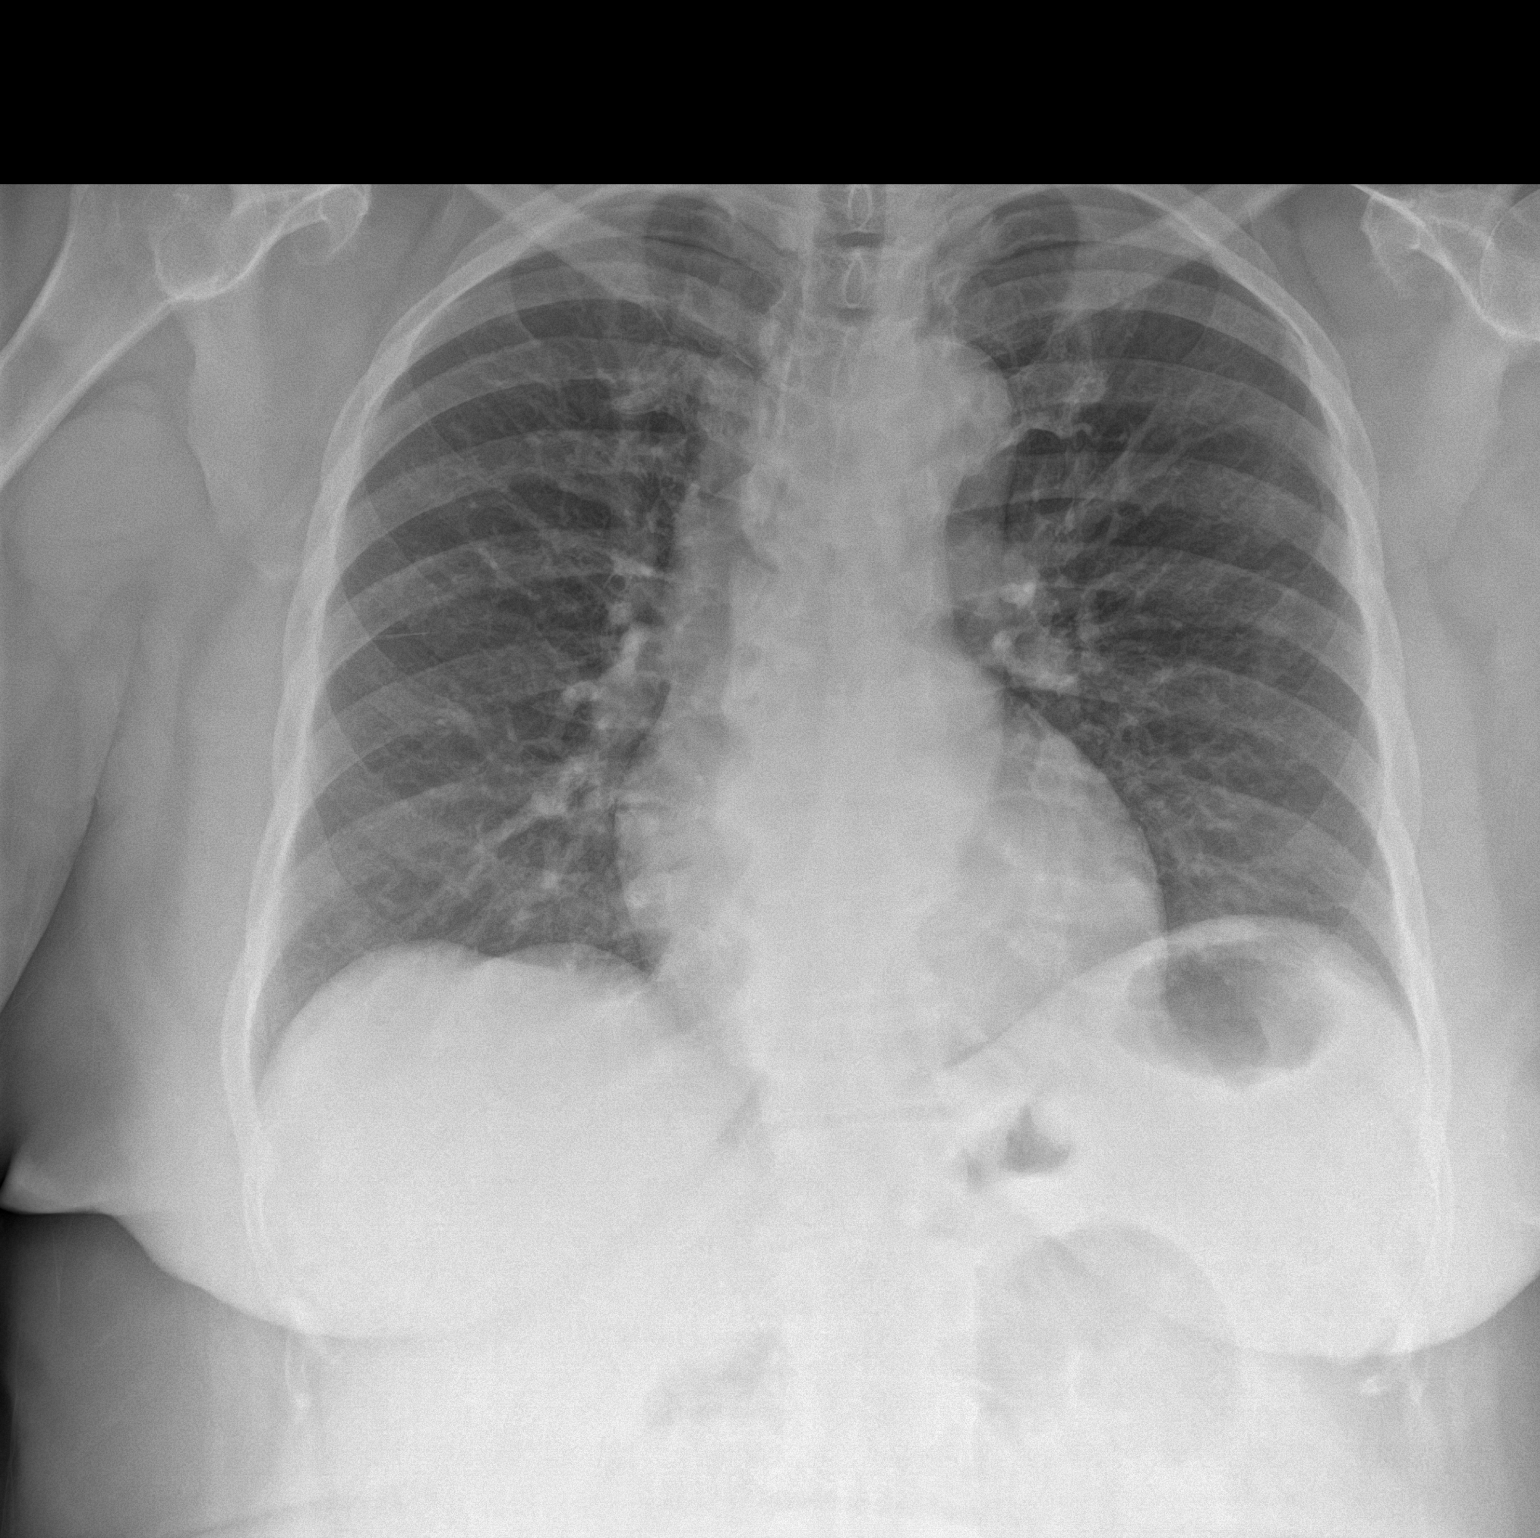

[chest lat]
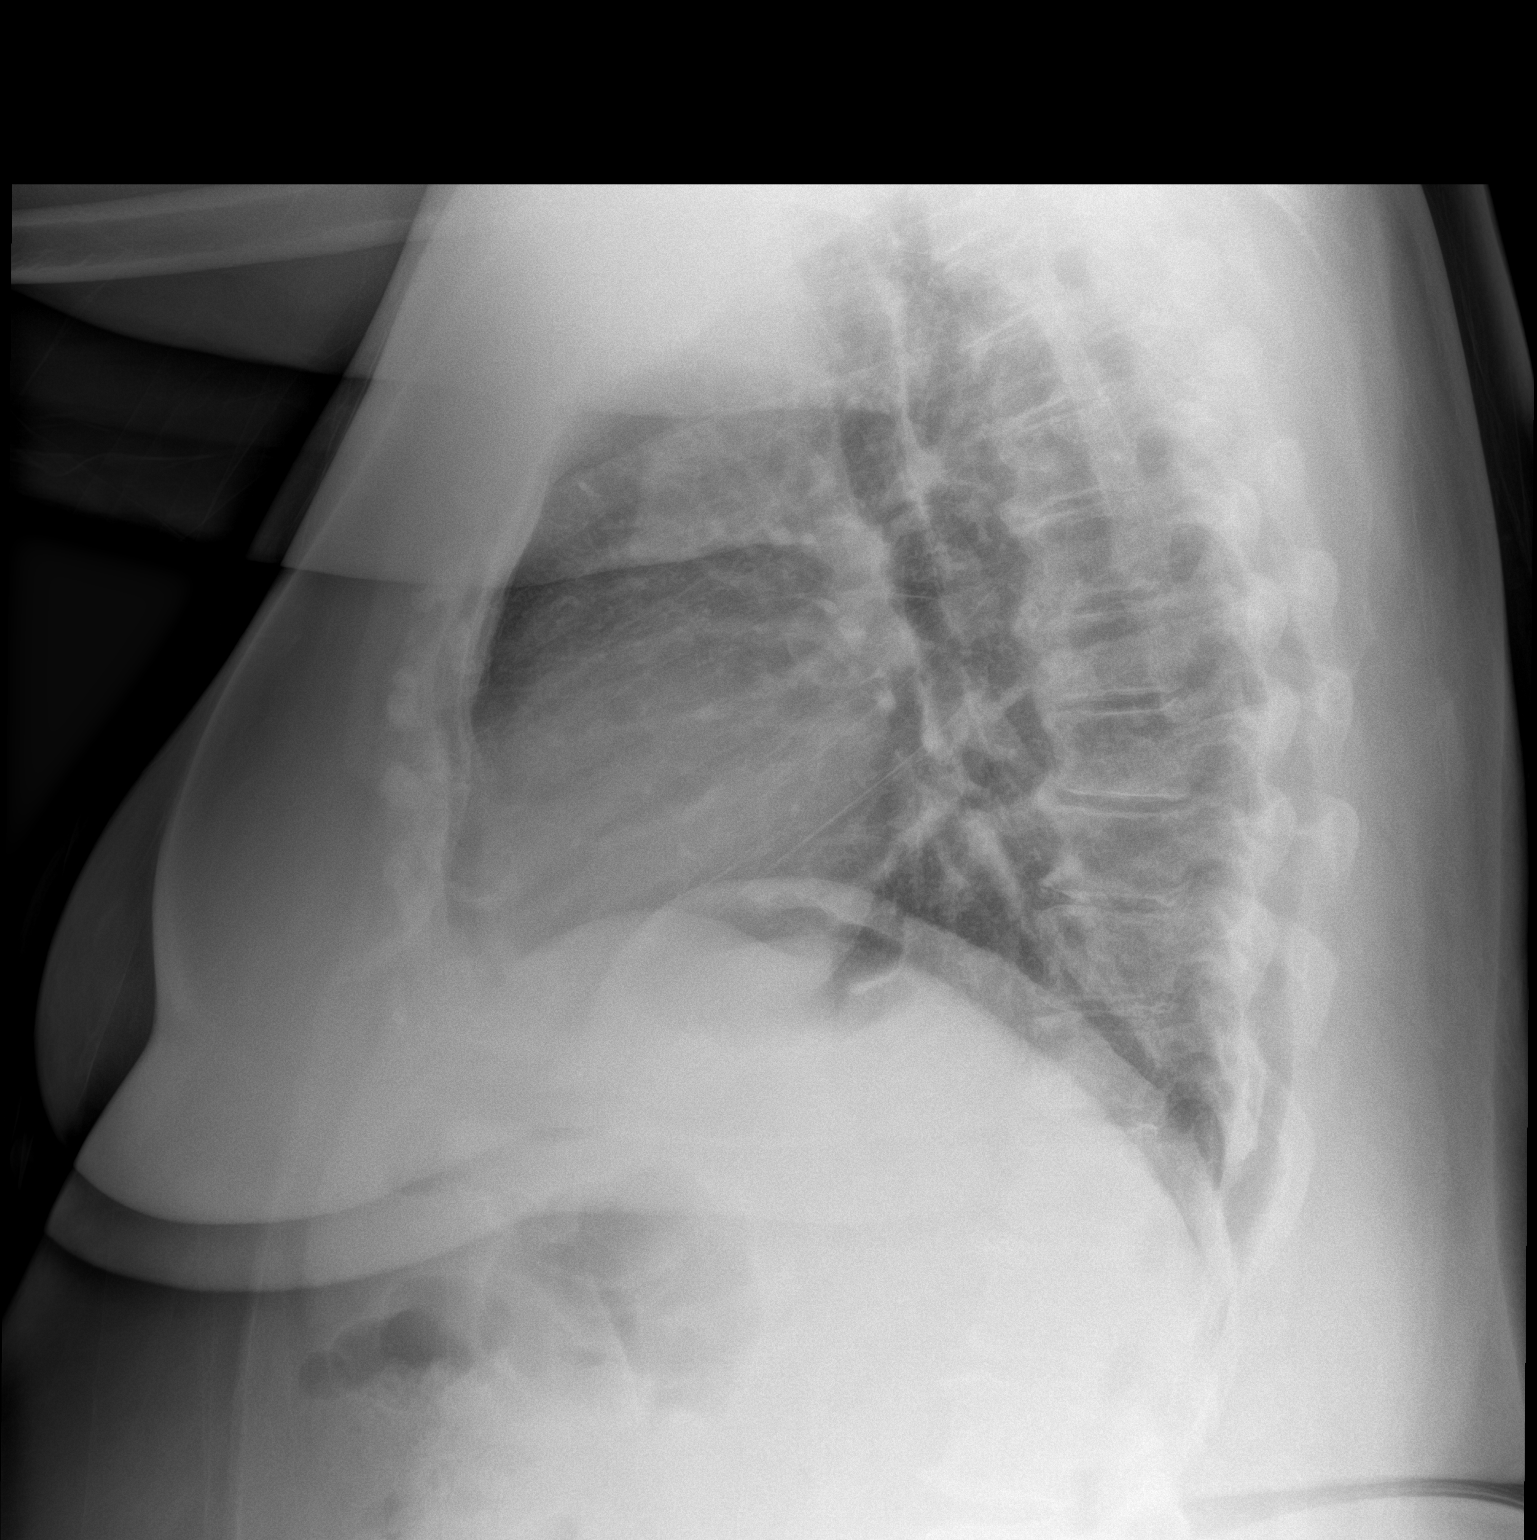

[2 of 2 positions shown; findings below may reference images not displayed]

FINDINGS: The heart and mediastinal contours are unchanged.

No focal consolidation. No pulmonary edema. No pleural effusion. No
pneumothorax.

No acute osseous abnormality.
IMPRESSION: No active cardiopulmonary disease.

## 2023-11-19 NOTE — Progress Notes (Signed)
 Cardiology Office Note:    Date:  11/26/2023   ID:  ARABELLE BOLLIG, DOB [birth year], MRN [id number]  PCP:  Etta Grandchild, MD   [facility] Providers Cardiologist:  Nicki Guadalajara, MD     Referring MD: Etta Grandchild, MD   Chief Complaint  Patient presents with   Follow-up    CAD    History of Present Illness:    MELVA FAUX is a 68 y.o. female with a hx of HTN, HLD, and breast cancer. She was intiially evaluated by Dr. Tresa Endo for chest pain following an ER visit when she ruled out with negative enzymes.  She was recommended for coronary CTA but this was not yet completed, possibly due to unacceptable heart rate when she presented.  She has not followed up with our cardiology group.  She was seen back in the ER 09/08/2023 with sore throat and positive sick contacts.  She was treated conservatively and discharged without admission.  She presented back to [facility] ED 09/22/2023 with sore throat and cough x 2 weeks and a UA positive for UTI.  Her troponin was found minimally elevated at 71 and 97 and a D-dimer 1.11.  She had a lactic acidosis of 1.7 which improved to 1.3 and her BNP was 45.6.  CTA was negative for PE and dissection but does show coronary artery disease and aortic atherosclerosis.  Cardiology was consulted for elevated troponin. 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   Initial plan was for OP ischemic evaluation if echo largely unremarkable. Echocardiogram showed normal BiV function and no significant valvular disease.  She presents today for cardiology follow-up. She recounts severe anxiety regarding taking the BB thinking her heart was going to stop. We reviewed other options such as PET stress test. She is going to think about it and get back to me. She continues to be active and has no CP or SOB.    Past Medical History:  Diagnosis Date   Primary hypertension 07/11/2021    Past Surgical  History:  Procedure Laterality Date   APPENDECTOMY     BREAST CYST EXCISION      Current Medications: Current Meds  Medication Sig   aspirin EC 81 MG tablet Take 1 tablet (81 mg total) by mouth daily. Swallow whole.   ezetimibe (ZETIA) 10 MG tablet Take 1 tablet (10 mg total) by mouth daily.   metoprolol tartrate (LOPRESSOR) 50 MG tablet Take 1 tablet 2 hours prior to cardiac CT & take the other tablet to your appointment     Allergies:   Crestor [rosuvastatin], Lipitor [atorvastatin], and Bactrim [sulfamethoxazole-trimethoprim]   Social History   Socioeconomic History   Marital status: Widowed    Spouse name: Not on file   Number of children: Not on file   Years of education: Not on file   Highest education level: Not on file  Occupational History   Not on file  Tobacco Use   Smoking status: Never   Smokeless tobacco: Never  Substance and Sexual Activity   Alcohol use: No   Drug use: Not Currently   Sexual activity: Not Currently    Partners: Male  Other Topics Concern   Not on file  Social History Narrative   Not on file   Social Drivers of Health   Financial Resource Strain: Not on file  Food Insecurity: No Food Insecurity (09/23/2023)   Hunger Vital  Sign    Worried About Programme researcher, broadcasting/film/video in the Last Year: Never true    Ran Out of Food in the Last Year: Never true  Transportation Needs: No Transportation Needs (09/23/2023)   PRAPARE - Administrator, Civil Service (Medical): No    Lack of Transportation (Non-Medical): No  Physical Activity: Not on file  Stress: Not on file  Social Connections: Not on file     Family History: The patient's family history includes Alcoholism in her father; Breast cancer in her mother; Coronary artery disease in her brother; Diabetes in her father; Renal cancer in her brother.  ROS:   Please see the history of present illness.     All other systems reviewed and are negative.  EKGs/Labs/Other Studies  Reviewed:    The following studies were reviewed today:  EKG Interpretation Date/Time:  Wednesday November 26 2023 08:16:26 EST Ventricular Rate:  72 PR Interval:  204 QRS Duration:  80 QT Interval:  392 QTC Calculation: 429 R Axis:   9  Text Interpretation: Normal sinus rhythm Normal ECG When compared with ECG of 22-Sep-2023 16:49, PREVIOUS ECG IS PRESENT Confirmed by Micah Flesher ([id number]) on 11/26/2023 8:22:53 AM    Recent Labs: 07/17/2023: TSH 3.15 09/22/2023: ALT 15; B Natriuretic Peptide 45.6 09/23/2023: BUN 8; Creatinine, Ser 0.85; Hemoglobin 12.4; Platelets 269; Potassium 3.4; Sodium 136  Recent Lipid Panel    Component Value Date/Time   CHOL 312 (H) 07/17/2023 1046   CHOL 288 (H) 10/09/2021 0911   TRIG 163.0 (H) 07/17/2023 1046   HDL 70.40 07/17/2023 1046   HDL 64 10/09/2021 0911   CHOLHDL 4 07/17/2023 1046   VLDL 32.6 07/17/2023 1046   LDLCALC 209 (H) 07/17/2023 1046   LDLCALC 197 (H) 10/09/2021 0911     Risk Assessment/Calculations:                Physical Exam:    VS:  BP 128/76   Pulse 72   Ht 5\' 7"  (1.702 m)   Wt 268 lb (121.6 kg)   SpO2 95%   BMI 41.97 kg/m     Wt Readings from Last 3 Encounters:  11/26/23 268 lb (121.6 kg)  09/23/23 272 lb 6.4 oz (123.6 kg)  07/17/23 277 lb (125.6 kg)     GEN:  Well nourished, well developed in no acute distress HEENT: Normal NECK: No JVD; No carotid bruits LYMPHATICS: No lymphadenopathy CARDIAC: RRR, no murmurs, rubs, gallops RESPIRATORY:  Clear to auscultation without rales, wheezing or rhonchi  ABDOMEN: Soft, non-tender, non-distended MUSCULOSKELETAL:  No edema; No deformity  SKIN: Warm and dry NEUROLOGIC:  Alert and oriented x 3 PSYCHIATRIC:  Normal affect   ASSESSMENT:    1. Hyperlipidemia with target LDL less than 100   2. Precordial pain   3. Chest tightness   4. Coronary artery disease involving native coronary artery of native heart without angina pectoris    PLAN:    In order of  problems listed above:  CAD Aortic atherosclerosis - Mild and flat troponin elevation in the ER in the setting of UTI -Prior CT scan with CAD and aortic atherosclerosis - Given risk factors including LDL greater than 200, recommend coronary CTA with 100 mg Lopressor -- she is concerned about anxiety - I offered 0.25 mg xanax, she declined -- ultimately she has decided to move forward with trying the CT coronary again - will give 50 mg lopressor x 2 doses, take one prior to scan  Hyperlipidemia with LDL goal < 70 07/17/2023: Cholesterol 312; HDL 70.40; LDL Cholesterol 209; Triglycerides 163.0; VLDL 32.6 LP(a) not drawn - intolerant to crestor and lipitor - started zetia - will likely need PCSK9i - will refer to pharmD for    Follow up in 3 months.          Medication Adjustments/Labs and Tests Ordered: Current medicines are reviewed at length with the patient today.  Concerns regarding medicines are outlined above.  Orders Placed This Encounter  Procedures   CT CORONARY MORPH W/CTA COR W/SCORE W/CA W/CM &/OR WO/CM   Lipid panel   CBC   Basic metabolic panel   Lipoprotein A (LPA)   AMB Referral to Heartcare Pharm-D   EKG 12-Lead   Meds ordered this encounter  Medications   metoprolol tartrate (LOPRESSOR) 50 MG tablet    Sig: Take 1 tablet 2 hours prior to cardiac CT & take the other tablet to your appointment    Dispense:  2 tablet    Refill:  0    Patient Instructions  Medication Instructions:  Metoprolol Tartrate 50 mg- take 1 tablet 2 hours prior to your cardiac CT & take the other tablet to your appointment.   Your physician recommends that you continue on your current medications as directed. Please refer to the Current Medication list given to you today.  *If you need a refill on your cardiac medications before your next appointment, please call your pharmacy*   Lab Work: Lipid Panel, CNBC, BMET, Lpa   Testing/Procedures:   Your cardiac CT will be  scheduled at one of the below locations:   [facility] [address] [place], [place] [address] [phone]    If scheduled at [facility], please arrive at the [facility] and Children's Entrance (Entrance C2) of [facility] 30 minutes prior to test start time. You can use the FREE valet parking offered at entrance C (encouraged to control the heart rate for the test)  Proceed to the [facility] Radiology Department (first floor) to check-in and test prep.  All radiology patients and guests should use entrance C2 at [facility], accessed from [facility], even though the hospital's physical address listed is [address]    If scheduled at [facility] or [facility], please arrive 15 mins early for check-in and test prep.  There is spacious parking and easy access to the radiology department from the [facility] Heart and Vascular entrance. Please enter here and check-in with the desk attendant.   If scheduled at [facility], please arrive 30 minutes early for check-in and test prep.  Please follow these instructions carefully (unless otherwise directed):  An IV will be required for this test and Nitroglycerin will be given.  Hold all erectile dysfunction medications at least 3 days (72 hrs) prior to test. (Ie viagra, cialis, sildenafil, tadalafil, etc)   On the Night Before the Test: Be sure to Drink plenty of water. Do not consume any caffeinated/decaffeinated beverages or chocolate 12 hours prior to your test. Do not take any antihistamines 12 hours prior to your test. If the patient has contrast allergy: Patient will need a prescription for Prednisone and very clear instructions (as follows): Prednisone 50 mg - take 13 hours prior to test Take another Prednisone 50 mg 7 hours prior to test Take another Prednisone 50 mg 1 hour prior to test Take Benadryl 50 mg 1 hour  prior  to test Patient must complete all four doses of above prophylactic medications. Patient will need a ride after test due to Benadryl.  On the Day of the Test: Drink plenty of water until 1 hour prior to the test. Do not eat any food 1 hour prior to test. You may take your regular medications prior to the test.  Take metoprolol (Lopressor) two hours prior to test. If you take Furosemide/Hydrochlorothiazide/Spironolactone/Chlorthalidone, please HOLD on the morning of the test. Patients who wear a continuous glucose monitor MUST remove the device prior to scanning. FEMALES- please wear underwire-free bra if available, avoid dresses & tight clothing  *For Clinical Staff only. Please instruct patient the following:* Heart Rate Medication Recommendations for Cardiac CT  Resting HR < 50 bpm  No medication  Resting HR 50-60 bpm and BP >110/50 mmHG   Consider Metoprolol tartrate 25 mg PO 90-120 min prior to scan  Resting HR 60-65 bpm and BP >110/50 mmHG  Metoprolol tartrate 50 mg PO 90-120 minutes prior to scan   Resting HR > 65 bpm and BP >110/50 mmHG  Metoprolol tartrate 100 mg PO 90-120 minutes prior to scan  Consider Ivabradine 10-15 mg PO or a calcium channel blocker for resting HR >60 bpm and contraindication to metoprolol tartrate  Consider Ivabradine 10-15 mg PO in combination with metoprolol tartrate for HR >80 bpm        After the Test: Drink plenty of water. After receiving IV contrast, you may experience a mild flushed feeling. This is normal. On occasion, you may experience a mild rash up to 24 hours after the test. This is not dangerous. If this occurs, you can take Benadryl 25 mg, Zyrtec, Claritin, or Allegra and increase your fluid intake. (Patients taking Tikosyn should avoid Benadryl, and may take Zyrtec, Claritin, or Allegra) If you experience trouble breathing, this can be serious. If it is severe call 911 IMMEDIATELY. If it is mild, please call our office.  We will  call to schedule your test 2-4 weeks out understanding that some insurance companies will need an authorization prior to the service being performed.   For more information and frequently asked questions, please visit our website : [email or web address]  For non-scheduling related questions, please contact the cardiac imaging nurse navigator should you have any questions/concerns: Cardiac Imaging Nurse Navigators Direct Office Dial: [phone]   For scheduling needs, including cancellations and rescheduling, please call [place], [phone].   Follow-Up: At [facility], you and your health needs are our priority.  As part of our continuing mission to provide you with exceptional heart care, we have created designated Provider Care Teams.  These Care Teams include your primary Cardiologist (physician) and Advanced Practice Providers (APPs -  Physician Assistants and Nurse Practitioners) who all work together to provide you with the care you need, when you need it.  We recommend signing up for the patient portal called "MyChart".  Sign up information is provided on this After Visit Summary.  MyChart is used to connect with patients for Virtual Visits (Telemedicine).  Patients are able to view lab/test results, encounter notes, upcoming appointments, etc.  Non-urgent messages can be sent to your provider as well.   To learn more about what you can do with MyChart, go to [email or web address].    Your next appointment:   1 month(s) Marylene Land Yassmine Tamm) Pharm D Appointment next available   Provider:   Micah Flesher, PA-C        Other Instructions  Signed, Marcelino Duster, [place]  11/26/2023 11:06 AM    [facility]

## 2023-11-26 ENCOUNTER — Encounter: Payer: Self-pay | Admitting: Physician Assistant

## 2023-11-26 ENCOUNTER — Ambulatory Visit: Payer: PPO | Attending: Physician Assistant | Admitting: Physician Assistant

## 2023-11-26 VITALS — BP 128/76 | HR 72 | Ht 67.0 in | Wt 268.0 lb

## 2023-11-26 DIAGNOSIS — E785 Hyperlipidemia, unspecified: Secondary | ICD-10-CM

## 2023-11-26 DIAGNOSIS — R0789 Other chest pain: Secondary | ICD-10-CM | POA: Diagnosis not present

## 2023-11-26 DIAGNOSIS — I2510 Atherosclerotic heart disease of native coronary artery without angina pectoris: Secondary | ICD-10-CM

## 2023-11-26 DIAGNOSIS — R072 Precordial pain: Secondary | ICD-10-CM | POA: Diagnosis not present

## 2023-11-26 LAB — CBC

## 2023-11-26 MED ORDER — METOPROLOL TARTRATE 50 MG PO TABS: ORAL_TABLET | ORAL | 0 refills | Status: DC

## 2023-11-26 NOTE — Patient Instructions (Addendum)
 Medication Instructions:  Metoprolol Tartrate 50 mg- take 1 tablet 2 hours prior to your cardiac CT & take the other tablet to your appointment.   Your physician recommends that you continue on your current medications as directed. Please refer to the Current Medication list given to you today.  *If you need a refill on your cardiac medications before your next appointment, please call your pharmacy*   Lab Work: Lipid Panel, CNBC, BMET, Lpa   Testing/Procedures:   Your cardiac CT will be scheduled at one of the below locations:   [facility] [address] [place], [place] [address] [phone]    If scheduled at [facility], please arrive at the [facility] and Children's Entrance (Entrance C2) of [facility] 30 minutes prior to test start time. You can use the FREE valet parking offered at entrance C (encouraged to control the heart rate for the test)  Proceed to the [facility] Radiology Department (first floor) to check-in and test prep.  All radiology patients and guests should use entrance C2 at [facility], accessed from [facility], even though the hospital's physical address listed is [address].    If scheduled at [facility] or [facility], please arrive 15 mins early for check-in and test prep.  There is spacious parking and easy access to the radiology department from the [facility] Heart and Vascular entrance. Please enter here and check-in with the desk attendant.   If scheduled at [facility], please arrive 30 minutes early for check-in and test prep.  Please follow these instructions carefully (unless otherwise directed):  An IV will be required for this test and Nitroglycerin will be given.  Hold all erectile dysfunction medications at least 3 days (72 hrs) prior to test. (Ie viagra, cialis, sildenafil, tadalafil, etc)   On the Night Before the  Test: Be sure to Drink plenty of water. Do not consume any caffeinated/decaffeinated beverages or chocolate 12 hours prior to your test. Do not take any antihistamines 12 hours prior to your test. If the patient has contrast allergy: Patient will need a prescription for Prednisone and very clear instructions (as follows): Prednisone 50 mg - take 13 hours prior to test Take another Prednisone 50 mg 7 hours prior to test Take another Prednisone 50 mg 1 hour prior to test Take Benadryl 50 mg 1 hour prior to test Patient must complete all four doses of above prophylactic medications. Patient will need a ride after test due to Benadryl.  On the Day of the Test: Drink plenty of water until 1 hour prior to the test. Do not eat any food 1 hour prior to test. You may take your regular medications prior to the test.  Take metoprolol (Lopressor) two hours prior to test. If you take Furosemide/Hydrochlorothiazide/Spironolactone/Chlorthalidone, please HOLD on the morning of the test. Patients who wear a continuous glucose monitor MUST remove the device prior to scanning. FEMALES- please wear underwire-free bra if available, avoid dresses & tight clothing  *For Clinical Staff only. Please instruct patient the following:* Heart Rate Medication Recommendations for Cardiac CT  Resting HR < 50 bpm  No medication  Resting HR 50-60 bpm and BP >110/50 mmHG   Consider Metoprolol tartrate 25 mg PO 90-120 min prior to scan  Resting HR 60-65 bpm and BP >110/50 mmHG  Metoprolol tartrate 50 mg PO 90-120 minutes prior to scan   Resting HR > 65 bpm and BP >  110/50 mmHG  Metoprolol tartrate 100 mg PO 90-120 minutes prior to scan  Consider Ivabradine 10-15 mg PO or a calcium channel blocker for resting HR >60 bpm and contraindication to metoprolol tartrate  Consider Ivabradine 10-15 mg PO in combination with metoprolol tartrate for HR >80 bpm        After the Test: Drink plenty of water. After receiving IV  contrast, you may experience a mild flushed feeling. This is normal. On occasion, you may experience a mild rash up to 24 hours after the test. This is not dangerous. If this occurs, you can take Benadryl 25 mg, Zyrtec, Claritin, or Allegra and increase your fluid intake. (Patients taking Tikosyn should avoid Benadryl, and may take Zyrtec, Claritin, or Allegra) If you experience trouble breathing, this can be serious. If it is severe call 911 IMMEDIATELY. If it is mild, please call our office.  We will call to schedule your test 2-4 weeks out understanding that some insurance companies will need an authorization prior to the service being performed.   For more information and frequently asked questions, please visit our website : [email or web address]  For non-scheduling related questions, please contact the cardiac imaging nurse navigator should you have any questions/concerns: Cardiac Imaging Nurse Navigators Direct Office Dial: [phone]   For scheduling needs, including cancellations and rescheduling, please call [place], [phone].   Follow-Up: At [facility], you and your health needs are our priority.  As part of our continuing mission to provide you with exceptional heart care, we have created designated Provider Care Teams.  These Care Teams include your primary Cardiologist (physician) and Advanced Practice Providers (APPs -  Physician Assistants and Nurse Practitioners) who all work together to provide you with the care you need, when you need it.  We recommend signing up for the patient portal called "MyChart".  Sign up information is provided on this After Visit Summary.  MyChart is used to connect with patients for Virtual Visits (Telemedicine).  Patients are able to view lab/test results, encounter notes, upcoming appointments, etc.  Non-urgent messages can be sent to your provider as well.   To learn more about what you can do with MyChart, go to  [email or web address].    Your next appointment:   1 month(s) Marylene Land Duke) Pharm D Appointment next available   Provider:   Micah Flesher, PA-C        Other Instructions

## 2023-11-28 ENCOUNTER — Telehealth: Payer: Self-pay

## 2023-11-28 DIAGNOSIS — E785 Hyperlipidemia, unspecified: Secondary | ICD-10-CM

## 2023-11-28 LAB — CBC
Hematocrit: 41.5 % (ref 34.0–46.6)
Hemoglobin: 13.7 g/dL (ref 11.1–15.9)
MCH: 30.9 pg (ref 26.6–33.0)
MCHC: 33 g/dL (ref 31.5–35.7)
MCV: 94 fL (ref 79–97)
Platelets: 289 10*3/uL (ref 150–450)
RBC: 4.44 x10E6/uL (ref 3.77–5.28)
RDW: 13.5 % (ref 11.7–15.4)
WBC: 5.3 10*3/uL (ref 3.4–10.8)

## 2023-11-28 LAB — BASIC METABOLIC PANEL
BUN/Creatinine Ratio: 15 (ref 12–28)
BUN: 13 mg/dL (ref 8–27)
CO2: 22 mmol/L (ref 20–29)
Calcium: 10.3 mg/dL (ref 8.7–10.3)
Chloride: 101 mmol/L (ref 96–106)
Creatinine, Ser: 0.87 mg/dL (ref 0.57–1.00)
Glucose: 89 mg/dL (ref 70–99)
Potassium: 4.6 mmol/L (ref 3.5–5.2)
Sodium: 141 mmol/L (ref 134–144)
eGFR: 72 mL/min/{1.73_m2} (ref >59)

## 2023-11-28 LAB — LIPID PANEL
Chol/HDL Ratio: 4.6 {ratio} — ABNORMAL HIGH (ref 0.0–4.4)
Cholesterol, Total: 305 mg/dL — ABNORMAL HIGH (ref 100–199)
HDL: 66 mg/dL (ref >39)
LDL Chol Calc (NIH): 199 mg/dL — ABNORMAL HIGH (ref 0–99)
Triglycerides: 211 mg/dL — ABNORMAL HIGH (ref 0–149)
VLDL Cholesterol Cal: 40 mg/dL (ref 5–40)

## 2023-11-28 LAB — LIPOPROTEIN A (LPA): Lipoprotein (a): 65.5 nmol/L (ref <75.0)

## 2023-11-28 NOTE — Telephone Encounter (Signed)
 Spoke with pt. Pt was notified of lab results and recommendations to see pharm-D. Pt was in agreement. Order placed for pharm-d. Please arrange appointment.

## 2023-12-15 ENCOUNTER — Telehealth (HOSPITAL_COMMUNITY): Payer: Self-pay | Admitting: *Deleted

## 2023-12-15 NOTE — Telephone Encounter (Signed)
 Attempted to call patient regarding upcoming cardiac CT appointment. Left message on voicemail with name and callback number  Larey Brick RN Navigator Cardiac Imaging [facility] Heart and Vascular Services [phone] Office [phone] Cell

## 2023-12-16 ENCOUNTER — Ambulatory Visit (HOSPITAL_COMMUNITY): Admission: RE | Admit: 2023-12-16 | Payer: PPO | Source: Ambulatory Visit

## 2023-12-25 ENCOUNTER — Telehealth: Payer: Self-pay | Admitting: Cardiovascular Disease

## 2023-12-25 NOTE — Telephone Encounter (Signed)
 Patient wants to know if she should reschedule her CT Morphology test as she was very anxious at her last visit.

## 2023-12-25 NOTE — Telephone Encounter (Signed)
 Patient identification verified by 2 forms. Marilynn Rail, RN    Called and spoke to patient  Patient states:   -unsure if Dr. Tresa Endo wanted her to repeat the CT  Informed patient:   -per 2/26 OV PA duke ordered CT and Metoprolol Rx sent to pharmacy   -can call [phone] for assistance with scheduling  Patient verbalized understanding, no questions at this time

## 2023-12-30 ENCOUNTER — Ambulatory Visit (INDEPENDENT_AMBULATORY_CARE_PROVIDER_SITE_OTHER): Payer: PPO

## 2023-12-30 VITALS — Ht 67.0 in | Wt 268.0 lb

## 2023-12-30 DIAGNOSIS — Z0000 Encounter for general adult medical examination without abnormal findings: Secondary | ICD-10-CM

## 2023-12-30 NOTE — Progress Notes (Signed)
 Subjective:   Alexandra Robinson is a 68 y.o. who presents for a Medicare Wellness preventive visit.  Visit Complete: Virtual I connected with  Alexandra Robinson on 12/30/23 by a audio enabled telemedicine application and verified that I am speaking with the correct person using two identifiers.  Patient Location: Home  Provider Location: Home Office  I discussed the limitations of evaluation and management by telemedicine. The patient expressed understanding and agreed to proceed.  Vital Signs: Because this visit was a virtual/telehealth visit, some criteria may be missing or patient reported. Any vitals not documented were not able to be obtained and vitals that have been documented are patient reported.  VideoDeclined- This patient declined Librarian, academic. Therefore the visit was completed with audio only.  Persons Participating in Visit: Patient.  AWV Questionnaire: No: Patient Medicare AWV questionnaire was not completed prior to this visit.  Cardiac Risk Factors include: advanced age (>67men, >52 women);hypertension;obesity (BMI >30kg/m2)     Objective:    Today's Vitals   12/30/23 0840 12/30/23 0902  Weight: 268 lb (121.6 kg)   Height: 5\' 7"  (1.702 m)   PainSc:  4    Body mass index is 41.97 kg/m.     12/30/2023    9:11 AM 09/23/2023    2:15 AM 09/22/2023    4:26 PM 08/08/2021    7:46 PM  Advanced Directives  Does Patient Have a Medical Advance Directive? No  No No  Would patient like information on creating a medical advance directive?  No - Patient declined  No - Patient declined    Current Medications (verified) Outpatient Encounter Medications as of 12/30/2023  Medication Sig   aspirin EC 81 MG tablet Take 1 tablet (81 mg total) by mouth daily. Swallow whole.   ezetimibe (ZETIA) 10 MG tablet Take 1 tablet (10 mg total) by mouth daily. (Patient not taking: Reported on 12/30/2023)   metoprolol tartrate (LOPRESSOR) 50 MG tablet Take 1  tablet 2 hours prior to cardiac CT & take the other tablet to your appointment (Patient not taking: Reported on 12/30/2023)   No facility-administered encounter medications on file as of 12/30/2023.    Allergies (verified) Crestor [rosuvastatin], Lipitor [atorvastatin], and Bactrim [sulfamethoxazole-trimethoprim]   History: Past Medical History:  Diagnosis Date   Primary hypertension 07/11/2021   Past Surgical History:  Procedure Laterality Date   APPENDECTOMY     BREAST CYST EXCISION     Family History  Problem Relation Age of Onset   Breast cancer Mother        late 68s    Diabetes Father    Alcoholism Father    Coronary artery disease Brother    Renal cancer Brother    Social History   Socioeconomic History   Marital status: Widowed    Spouse name: Not on file   Number of children: 5   Years of education: Not on file   Highest education level: Not on file  Occupational History   Not on file  Tobacco Use   Smoking status: Never   Smokeless tobacco: Never  Vaping Use   Vaping status: Never Used  Substance and Sexual Activity   Alcohol use: No   Drug use: Not Currently   Sexual activity: Not Currently    Partners: Male  Other Topics Concern   Not on file  Social History Narrative   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   Social Drivers of Health   Financial Resource Strain: Medium  Risk (12/30/2023)   Overall Financial Resource Strain (CARDIA)    Difficulty of Paying Living Expenses: Somewhat hard  Food Insecurity: No Food Insecurity (12/30/2023)   Hunger Vital Sign    Worried About Running Out of Food in the Last Year: Never true    Ran Out of Food in the Last Year: Never true  Transportation Needs: No Transportation Needs (12/30/2023)   PRAPARE - Administrator, Civil Service (Medical): No    Lack of Transportation (Non-Medical): No  Physical Activity: Inactive (12/30/2023)   Exercise Vital Sign    Days of Exercise per Week: 0 days    Minutes of  Exercise per Session: 0 min  Stress: No Stress Concern Present (12/30/2023)   [employer] of Occupational Health - Occupational Stress Questionnaire    Feeling of Stress : Only a little  Social Connections: Moderately Integrated (12/30/2023)   Social Connection and Isolation Panel [NHANES]    Frequency of Communication with Friends and Family: More than three times a week    Frequency of Social Gatherings with Friends and Family: More than three times a week    Attends Religious Services: More than 4 times per year    Active Member of [employer] or Organizations: Yes    Attends Banker Meetings: Never    Marital Status: Widowed    Tobacco Counseling Counseling given: Not Answered    Clinical Intake:  Pre-visit preparation completed: Yes  Pain : 0-10 Pain Score: 4  Pain Type: Chronic pain (Left sided sciatica) Pain Location: Leg Pain Orientation: Left Pain Radiating Towards: Left sided sciatica Pain Descriptors / Indicators: Discomfort, Numbness, Aching Pain Onset: More than a month ago Pain Frequency: Intermittent Pain Relieving Factors: Extra Stregnth Tylenol  Pain Relieving Factors: Extra Stregnth Tylenol  BMI - recorded: 41.97 Nutritional Status: BMI > 30  Obese Nutritional Risks: None Diabetes: No  Lab Results  Component Value Date   HGBA1C 6.0 07/17/2023   HGBA1C 5.9 03/20/2022   HGBA1C 6.0 07/11/2021     How often do you need to have someone help you when you read instructions, pamphlets, or other written materials from your doctor or pharmacy?: 1 - Never  Interpreter Needed?: No  Information entered by :: Makailyn Mccormick, RMA   Activities of Daily Living     12/30/2023    9:05 AM 09/23/2023    2:02 AM  In your present state of health, do you have any difficulty performing the following activities:  Hearing? 0 0  Vision? 0 0  Difficulty concentrating or making decisions? 0 0  Walking or climbing stairs? 0   Dressing or bathing? 0   Doing  errands, shopping? 0 0  Preparing Food and eating ? N   Using the Toilet? N   In the past six months, have you accidently leaked urine? N   Do you have problems with loss of bowel control? N   Managing your Medications? N   Managing your Finances? N   Housekeeping or managing your Housekeeping? N     Patient Care Team: Etta Grandchild, MD as PCP - General (Internal Medicine) Lennette Bihari, MD as PCP - Cardiology (Cardiology)  Indicate any recent Medical Services you may have received from other than [facility] providers in the past year (date may be approximate).     Assessment:   This is a routine wellness examination for [facility].  Hearing/Vision screen Hearing Screening - Comments:: Denies hearing difficulties   Vision Screening - Comments::  Wears eyeglasses   Goals Addressed               This Visit's Progress     Patient Stated (pt-stated)        Drink more water and lose weight       Depression Screen     12/30/2023    9:18 AM 07/17/2023    9:42 AM 12/05/2022   10:10 AM 07/11/2021    8:08 AM  PHQ 2/9 Scores  PHQ - 2 Score 1 3 0 1  PHQ- 9 Score 1 10 0     Fall Risk     12/30/2023    9:13 AM 07/17/2023    9:42 AM 12/05/2022   10:10 AM  Fall Risk   Falls in the past year? 0 0 0  Number falls in past yr: 0 0 0  Injury with Fall? 0 0 0  Risk for fall due to : No Fall Risks No Fall Risks No Fall Risks  Follow up Falls prevention discussed;Falls evaluation completed Falls evaluation completed Falls evaluation completed    MEDICARE RISK AT HOME:  Medicare Risk at Home Any stairs in or around the home?: Yes If so, are there any without handrails?: Yes Home free of loose throw rugs in walkways, pet beds, electrical cords, etc?: Yes Adequate lighting in your home to reduce risk of falls?: Yes Life alert?: No Use of a cane, walker or w/c?: No Grab bars in the bathroom?: No Shower chair or bench in shower?: No Elevated toilet seat or a handicapped toilet?:  No  TIMED UP AND GO:  Was the test performed?  No  Cognitive Function: 6CIT completed        12/30/2023    9:14 AM  6CIT Screen  What Year? 0 points  What month? 0 points  What time? 0 points  Count back from 20 0 points  Months in reverse 0 points  Repeat phrase 0 points  Total Score 0 points    Immunizations Immunization History  Administered Date(s) Administered   Tdap 12/30/2007    Screening Tests Health Maintenance  Topic Date Due   COVID-19 Vaccine (1) Never done   Zoster Vaccines- Shingrix (1 of 2) Never done   DTaP/Tdap/Td (2 - Td or Tdap) 12/29/2017   DEXA SCAN  Never done   MAMMOGRAM  02/26/2020   Pneumonia Vaccine 28+ Years old (1 of 2 - PCV) 01/27/2024 (Originally 10/16/1961)   INFLUENZA VACCINE  04/30/2024   Fecal DNA (Cologuard)  09/02/2024   Medicare Annual Wellness (AWV)  12/29/2024   Hepatitis C Screening  Completed   HPV VACCINES  Aged Out    Health Maintenance  Health Maintenance Due  Topic Date Due   COVID-19 Vaccine (1) Never done   Zoster Vaccines- Shingrix (1 of 2) Never done   DTaP/Tdap/Td (2 - Td or Tdap) 12/29/2017   DEXA SCAN  Never done   MAMMOGRAM  02/26/2020   Health Maintenance Items Addressed: See Nurse Notes  Additional Screening:  Vision Screening: Recommended annual ophthalmology exams for early detection of glaucoma and other disorders of the eye.  Dental Screening: Recommended annual dental exams for proper oral hygiene  Community Resource Referral / Chronic Care Management: CRR required this visit?  No   CCM required this visit?  No     Plan:     I have personally reviewed and noted the following in the patient's chart:   Medical and social history Use of alcohol, tobacco or  illicit drugs  Current medications and supplements including opioid prescriptions. Patient is not currently taking opioid prescriptions. Functional ability and status Nutritional status Physical activity Advanced directives List  of other physicians Hospitalizations, surgeries, and ER visits in previous 12 months Vitals Screenings to include cognitive, depression, and falls Referrals and appointments  In addition, I have reviewed and discussed with patient certain preventive protocols, quality metrics, and best practice recommendations. A written personalized care plan for preventive services as well as general preventive health recommendations were provided to patient.     Jasa Dundon L Heinz Eckert, CMA   12/30/2023   After Visit Summary: (Mail) Due to this being a telephonic visit, the after visit summary with patients personalized plan was offered to patient via mail   Notes: Please refer to Routing Comments.

## 2023-12-30 NOTE — Patient Instructions (Signed)
 Ms. Agnes , Thank you for taking time to come for your Medicare Wellness Visit. I appreciate your ongoing commitment to your health goals. Please review the following plan we discussed and let me know if I can assist you in the future.   Referrals/Orders/Follow-Ups/Clinician Recommendations: It was nice talking with you today.  You are due for a tetanus vaccine and a bone density screening.  You are also due for a yearly eye exam.  Remember to discuss the bone density screening with Dr. Yetta Barre during your next office visit.  Also discuss any recommendations he may have for an eye doctor.  Aim for 30 minutes of exercise or brisk walking, 6-8 glasses of water, and 5 servings of fruits and vegetables each day.   This is a list of the screening recommended for you and due dates:  Health Maintenance  Topic Date Due   COVID-19 Vaccine (1) Never done   Zoster (Shingles) Vaccine (1 of 2) Never done   DTaP/Tdap/Td vaccine (2 - Td or Tdap) 12/29/2017   DEXA scan (bone density measurement)  Never done   Mammogram  02/26/2020   Pneumonia Vaccine (1 of 2 - PCV) 01/27/2024*   Flu Shot  04/30/2024   Cologuard (Stool DNA test)  09/02/2024   Medicare Annual Wellness Visit  12/29/2024   Hepatitis C Screening  Completed   HPV Vaccine  Aged Out  *Topic was postponed. The date shown is not the original due date.    Advanced directives: (Declined) Advance directive discussed with you today. Even though you declined this today, please call our office should you change your mind, and we can give you the proper paperwork for you to fill out.  Next Medicare Annual Wellness Visit scheduled for next year: Yes

## 2024-01-06 ENCOUNTER — Telehealth (HOSPITAL_COMMUNITY): Payer: Self-pay | Admitting: *Deleted

## 2024-01-06 NOTE — Telephone Encounter (Signed)
 Reaching out to patient to offer assistance regarding upcoming cardiac imaging study; pt request to reschedule appt.  Appt rescheduled. Johney Frame RN Navigator Cardiac Imaging [facility] [phone] office [phone] cell

## 2024-01-07 ENCOUNTER — Ambulatory Visit (HOSPITAL_COMMUNITY)

## 2024-01-12 ENCOUNTER — Ambulatory Visit: Payer: PPO | Attending: Internal Medicine | Admitting: Pharmacist

## 2024-01-12 NOTE — Progress Notes (Deleted)
 Patient ID: Alexandra Robinson                 DOB: 20-Jul-1956                    MRN: [id number]     HPI: Alexandra Robinson is a 68 y.o. female patient referred to lipid clinic by ***. PMH is significant for   Current Medications:    Intolerances:  Atorvastatin Rosuvastatin  Risk Factors:  LDL goal:   Diet:   Exercise:   Family History:   Social History:   Labs: TC 305, Trigs 211, HDL 66, LDL 199  Past Medical History:  Diagnosis Date   Primary hypertension 07/11/2021    Current Outpatient Medications on File Prior to Visit  Medication Sig Dispense Refill   aspirin EC 81 MG tablet Take 1 tablet (81 mg total) by mouth daily. Swallow whole. 90 tablet 0   ezetimibe (ZETIA) 10 MG tablet Take 1 tablet (10 mg total) by mouth daily. (Patient not taking: Reported on 12/30/2023) 30 tablet 11   No current facility-administered medications on file prior to visit.    Allergies  Allergen Reactions   Crestor [Rosuvastatin] Other (See Comments)    myalgias   Lipitor [Atorvastatin] Other (See Comments)    myalgias   Bactrim [Sulfamethoxazole-Trimethoprim] Hives    Assessment/Plan:  1. Hyperlipidemia -  Reviewed options for lowering LDL cholesterol, including statins, ezetimibe, PCSK9 inhibitors, Nexletol/Nexlizet, and Leqvio. Discussed efficacy, dosing, side effects, and copay information.

## 2024-01-15 ENCOUNTER — Telehealth (HOSPITAL_COMMUNITY): Payer: Self-pay | Admitting: *Deleted

## 2024-01-15 NOTE — Telephone Encounter (Signed)
 Reaching out to patient to offer assistance regarding upcoming cardiac imaging study; patient request to cancel and reschedule test.   Appt rescheduled.   Kerri Peed RN Navigator Cardiac Imaging [facility] [phone] office [phone] cell

## 2024-01-16 ENCOUNTER — Ambulatory Visit (HOSPITAL_COMMUNITY)

## 2024-01-27 ENCOUNTER — Ambulatory Visit: Admitting: Internal Medicine

## 2024-01-30 ENCOUNTER — Telehealth (HOSPITAL_COMMUNITY): Payer: Self-pay | Admitting: *Deleted

## 2024-01-30 NOTE — Telephone Encounter (Signed)
 Attempted to call patient regarding upcoming cardiac CT appointment. Left message on voicemail with name and callback number Johney Frame RN Navigator Cardiac Imaging [facility] [phone] Office

## 2024-02-02 ENCOUNTER — Ambulatory Visit (HOSPITAL_COMMUNITY)

## 2024-02-18 ENCOUNTER — Telehealth (HOSPITAL_COMMUNITY): Payer: Self-pay | Admitting: *Deleted

## 2024-02-18 MED ORDER — METOPROLOL TARTRATE 50 MG PO TABS
ORAL_TABLET | ORAL | 0 refills | Status: AC
Start: 2024-02-18 — End: ?

## 2024-02-18 NOTE — Telephone Encounter (Signed)
 Reaching out to patient to offer assistance regarding upcoming cardiac imaging study; pt verbalizes understanding of appt date/time, parking situation and where to check in, pre-test NPO status and medications ordered, and verified current allergies; name and call back number provided for further questions should they arise Johney Frame RN Navigator Cardiac Imaging [facility] [phone] office [phone] cell

## 2024-02-19 ENCOUNTER — Ambulatory Visit (HOSPITAL_COMMUNITY)

## 2024-03-08 NOTE — Progress Notes (Deleted)
 Office Visit    Patient Name: Alexandra Robinson Date of Encounter: 03/08/2024  Primary Care Provider:  Arcadio Knuckles, MD Primary Cardiologist:  Magnus Schuller, MD  Chief Complaint    Hyperlipidemia - familial  Significant Past Medical History   CAD Shown on CT, scheduled for CT coronary w/scoring  preDM 10/24 A1c 6              Allergies  Allergen Reactions   Crestor  [Rosuvastatin ] Other (See Comments)    myalgias   Lipitor [Atorvastatin ] Other (See Comments)    myalgias   Bactrim [Sulfamethoxazole -Trimethoprim] Hives    History of Present Illness    Alexandra Robinson is a 68 y.o. female patient of Dr Loetta Ringer, in the office today to discuss options for cholesterol management.  Most recently seen by Romualdo Cobble PA and found to have LDL cholesterol of 199, but was as high as 207 last year.   Insurance Carrier: [employer] Plan 1  No deductible, $47/month  Pharmacy:     [facility]:      LDL Cholesterol goal:    Current Medications:     Previously tried:    Family Hx:     Social Hx: Tobacco: Alcohol:      Diet:      Exercise:   Adherence Assessment  Do you ever forget to take your medication? [] Yes [] No  Do you ever skip doses due to side effects? [] Yes [] No  Do you have trouble affording your medicines? [] Yes [] No  Are you ever unable to pick up your medication due to transportation difficulties? [] Yes [] No  Do you ever stop taking your medications because you don't believe they are helping? [] Yes [] No  Do you check your weight daily? [] Yes [] No   Adherence strategy: ***  Barriers to obtaining medications: ***     Accessory Clinical Findings   Lab Results  Component Value Date   CHOL 305 (H) 11/26/2023   HDL 66 11/26/2023   LDLCALC 199 (H) 11/26/2023   TRIG 211 (H) 11/26/2023   CHOLHDL 4.6 (H) 11/26/2023    Lipoprotein (a)  Date/Time Value Ref Range Status  11/26/2023 09:17 AM 65.5 <75.0 nmol/L Final    Comment:    Note:   Values greater than or equal to 75.0 nmol/L may        indicate an independent risk factor for CHD,        but must be evaluated with caution when applied        to non-Caucasian populations due to the        influence of genetic factors on Lp(a) across        ethnicities.     Lab Results  Component Value Date   ALT 15 09/22/2023   AST 20 09/22/2023   ALKPHOS 54 09/22/2023   BILITOT 0.8 09/22/2023   Lab Results  Component Value Date   CREATININE 0.87 11/26/2023   BUN 13 11/26/2023   NA 141 11/26/2023   K 4.6 11/26/2023   CL 101 11/26/2023   CO2 22 11/26/2023   Lab Results  Component Value Date   HGBA1C 6.0 07/17/2023    Home Medications    Current Outpatient Medications  Medication Sig Dispense Refill   aspirin  EC 81 MG tablet Take 1 tablet (81 mg total) by mouth daily. Swallow whole. 90 tablet 0   ezetimibe  (ZETIA ) 10 MG tablet Take 1 tablet (10 mg total) by mouth daily. (Patient not taking: Reported on 12/30/2023)  30 tablet 11   metoprolol  tartrate (LOPRESSOR ) 50 MG tablet Take 50 mg (1 tablet) TWO hours prior to CT scan 1 tablet 0   No current facility-administered medications for this visit.     Assessment & Plan    No problem-specific Assessment & Plan notes found for this encounter.   Torrance Stockley, PharmD CPP [facility] [address]   [place], [place] [address] [phone]  03/08/2024, 4:13 PM

## 2024-03-09 ENCOUNTER — Ambulatory Visit: Attending: Cardiology | Admitting: Pharmacist Clinician (PhC)/ Clinical Pharmacy Specialist

## 2024-12-13 ENCOUNTER — Encounter: Admitting: Internal Medicine

## 2024-12-30 ENCOUNTER — Ambulatory Visit

## 2025-01-14 ENCOUNTER — Ambulatory Visit
